# Patient Record
Sex: Female | Born: 1950 | Race: White | Hispanic: No | Marital: Single | Smoking: Current some day smoker
Health system: Southern US, Community
[De-identification: ages and names within clinical notes are randomized; demographics above are authoritative.]

## PROBLEM LIST (undated history)

## (undated) DIAGNOSIS — S7410XA Injury of femoral nerve at hip and thigh level, unspecified leg, initial encounter: Secondary | ICD-10-CM

## (undated) DIAGNOSIS — C801 Malignant (primary) neoplasm, unspecified: Secondary | ICD-10-CM

## (undated) DIAGNOSIS — E119 Type 2 diabetes mellitus without complications: Secondary | ICD-10-CM

## (undated) DIAGNOSIS — H9190 Unspecified hearing loss, unspecified ear: Secondary | ICD-10-CM

## (undated) DIAGNOSIS — F419 Anxiety disorder, unspecified: Secondary | ICD-10-CM

## (undated) DIAGNOSIS — I10 Essential (primary) hypertension: Secondary | ICD-10-CM

## (undated) DIAGNOSIS — G4733 Obstructive sleep apnea (adult) (pediatric): Secondary | ICD-10-CM

## (undated) DIAGNOSIS — E079 Disorder of thyroid, unspecified: Secondary | ICD-10-CM

## (undated) DIAGNOSIS — E785 Hyperlipidemia, unspecified: Secondary | ICD-10-CM

## (undated) HISTORY — DX: Obstructive sleep apnea (adult) (pediatric): G47.33

## (undated) HISTORY — PX: ACOUSTIC NEUROMA RESECTION: SHX5713

## (undated) HISTORY — PX: BLADDER SURGERY: SHX569

## (undated) HISTORY — PX: ECTOPIC PREGNANCY SURGERY: SHX613

## (undated) HISTORY — PX: ABDOMINAL HYSTERECTOMY: SHX81

## (undated) HISTORY — PX: CARDIAC CATHETERIZATION: SHX172

---

## 2005-09-19 ENCOUNTER — Ambulatory Visit: Payer: Self-pay | Admitting: Unknown Physician Specialty

## 2005-09-20 ENCOUNTER — Ambulatory Visit: Payer: Self-pay | Admitting: Unknown Physician Specialty

## 2005-12-25 ENCOUNTER — Ambulatory Visit: Payer: Self-pay | Admitting: Surgery

## 2006-03-06 ENCOUNTER — Ambulatory Visit: Payer: Self-pay | Admitting: Surgery

## 2006-03-08 ENCOUNTER — Ambulatory Visit: Payer: Self-pay | Admitting: Surgery

## 2010-02-17 ENCOUNTER — Observation Stay: Payer: Self-pay | Admitting: Internal Medicine

## 2010-09-07 ENCOUNTER — Emergency Department: Payer: Self-pay | Admitting: Emergency Medicine

## 2010-09-23 ENCOUNTER — Emergency Department: Payer: Self-pay | Admitting: Unknown Physician Specialty

## 2010-10-03 ENCOUNTER — Ambulatory Visit: Payer: Self-pay | Admitting: Internal Medicine

## 2010-10-20 ENCOUNTER — Ambulatory Visit: Payer: Self-pay | Admitting: Internal Medicine

## 2011-04-30 ENCOUNTER — Ambulatory Visit: Payer: Self-pay | Admitting: Internal Medicine

## 2012-03-24 IMAGING — CR DG KNEE COMPLETE 4+V*R*
1 series · 4 of 4 positions shown · non-contrast
Comparison: none

REASON FOR EXAM: pain 2nd to fall
COMMENTS:   May transport without cardiac monitor

PROCEDURE:     DXR - DXR KNEE RT COMP WITH OBLIQUES  - September 07, 2010  [DATE]
RESULT:     No fracture, dislocation or other acute bony abnormality is
identified. The knee joint space is well maintained. The patella is intact.

[Series 1: view not recorded · 0.17mm/px · 4 of 4 slices shown]
[im 1/4]
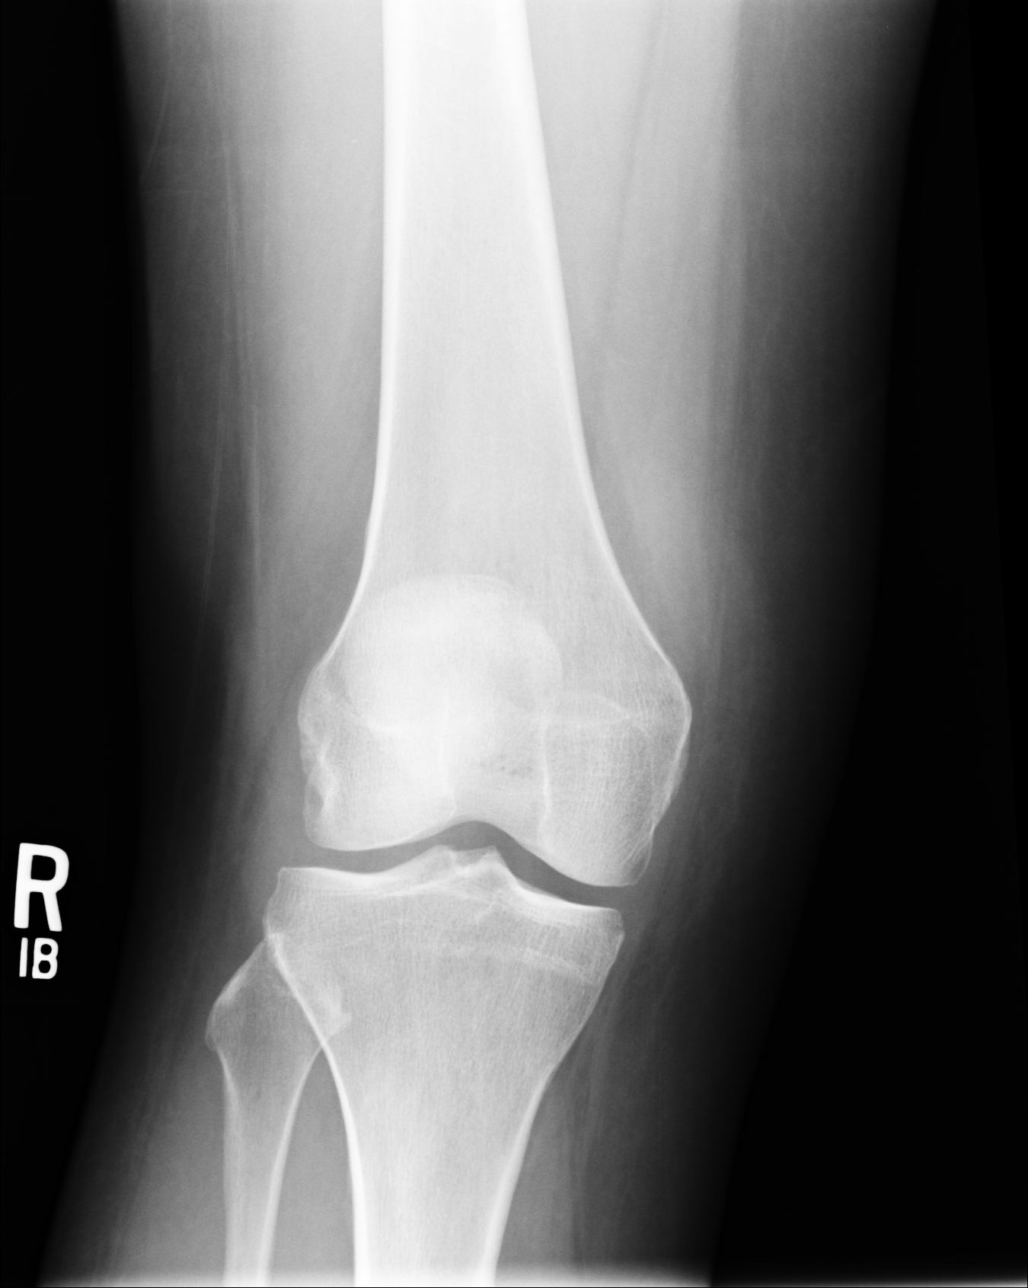
[im 2/4]
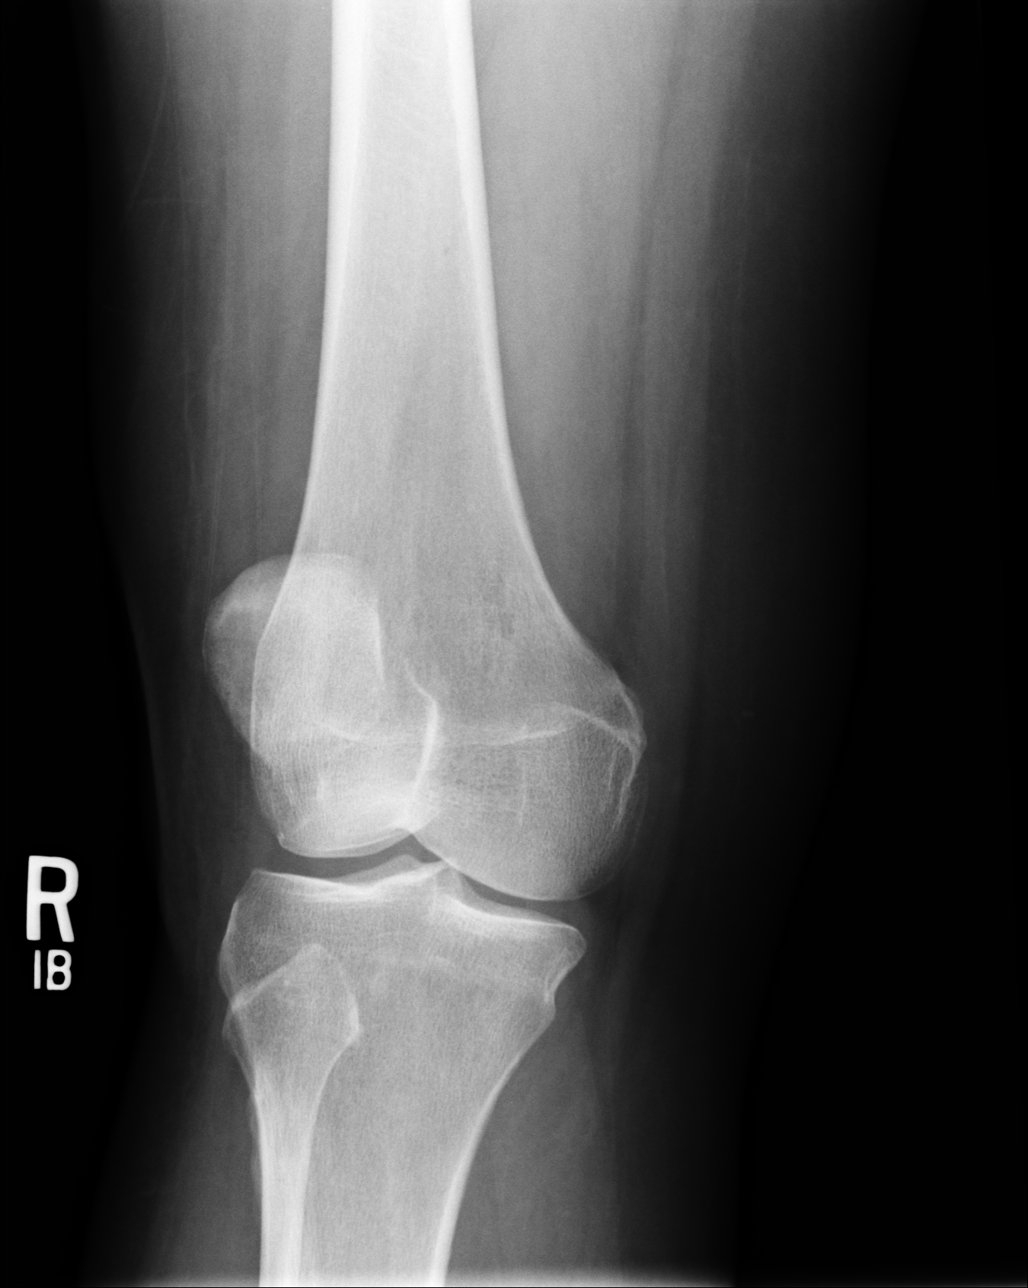
[im 3/4]
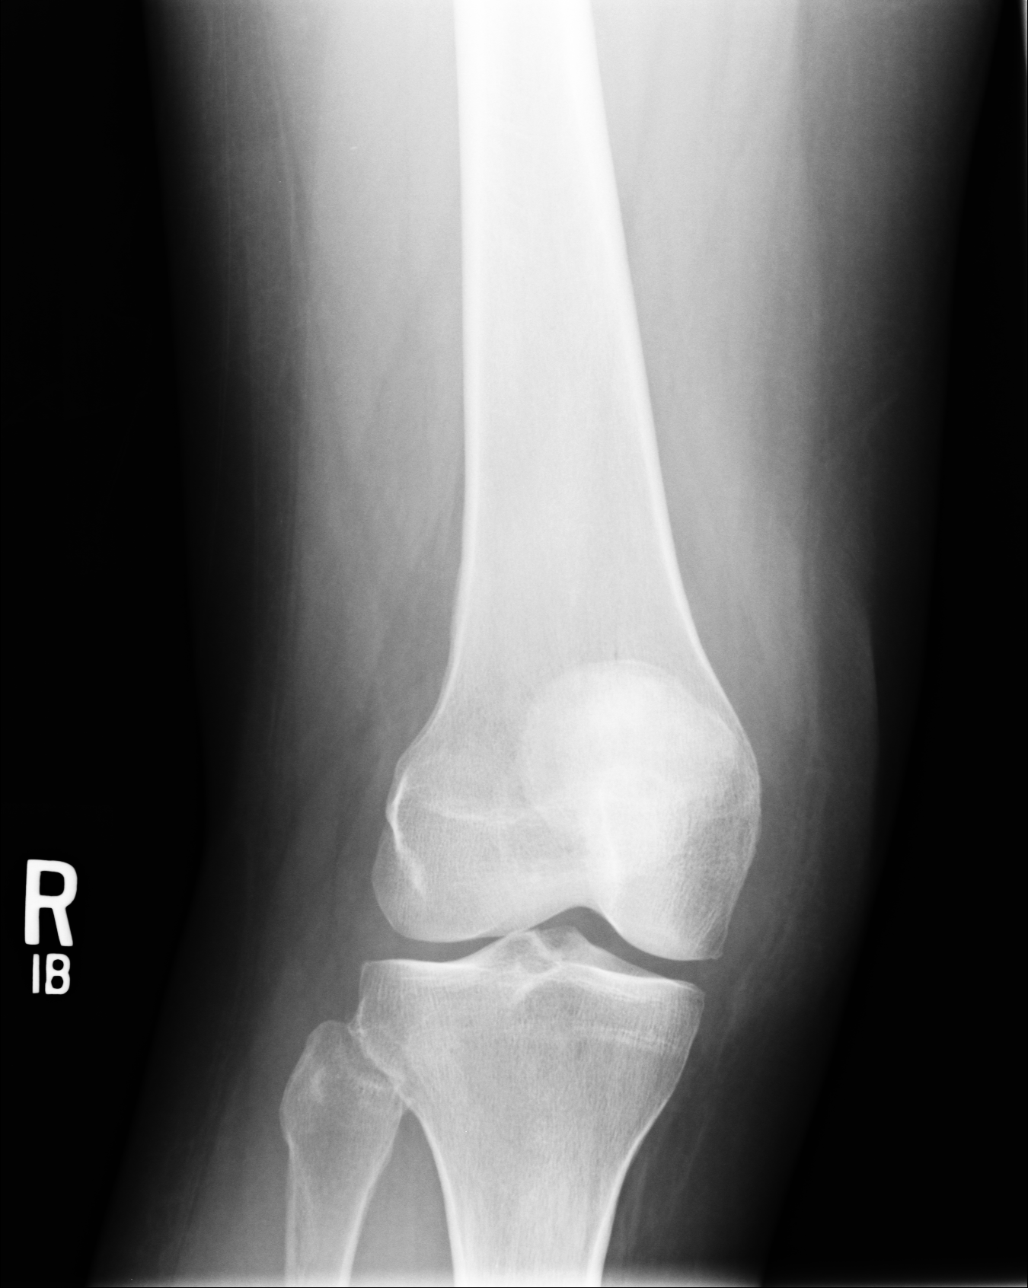
[im 4/4]
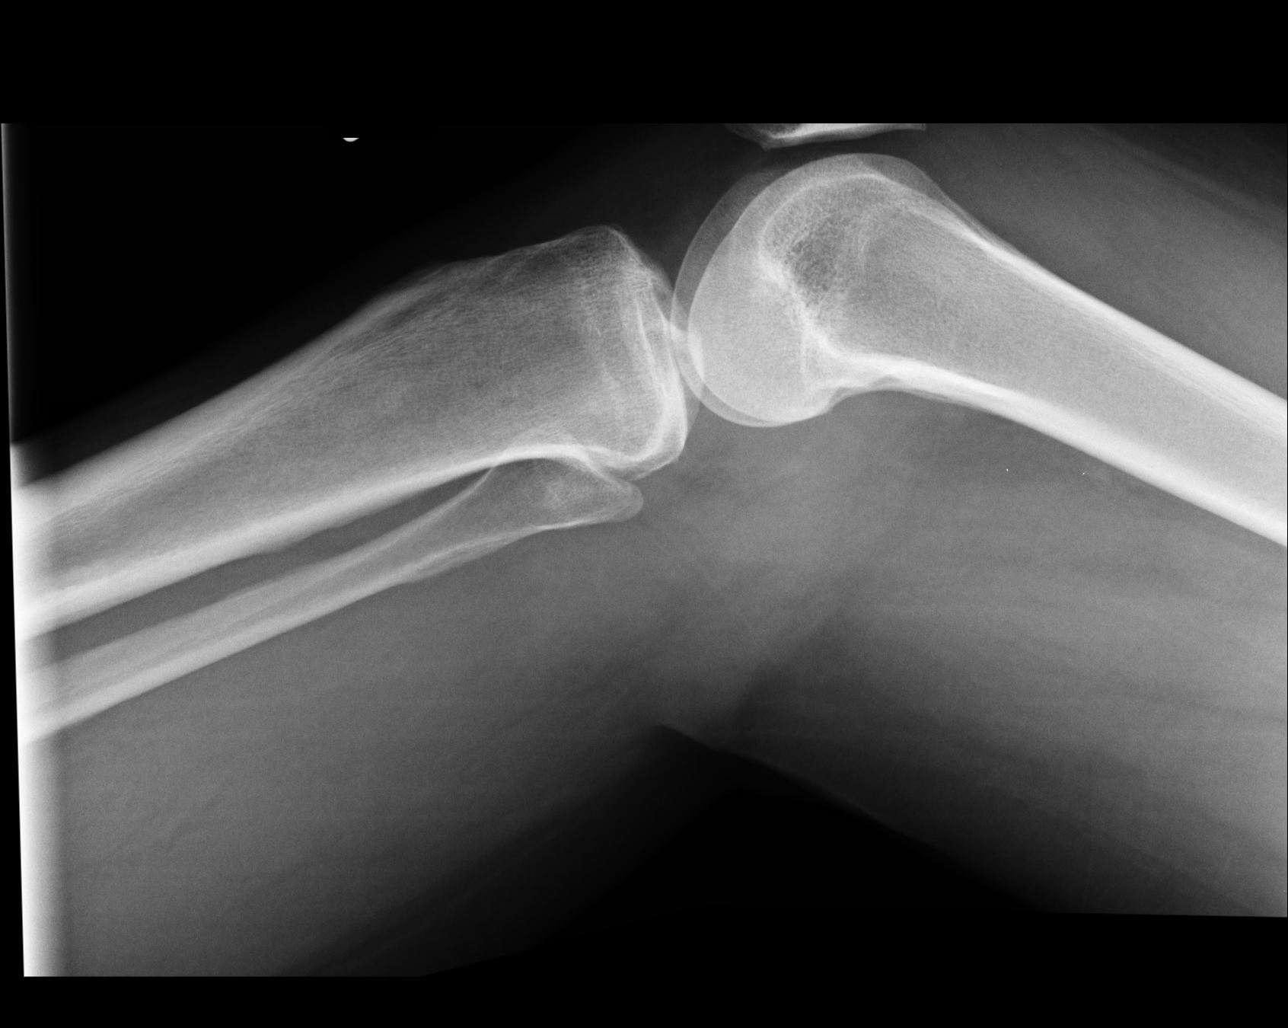

[4 of 4 positions shown; findings below may reference images not displayed]

IMPRESSION: 1.     No acute changes are identified.

## 2015-11-04 ENCOUNTER — Encounter: Payer: Self-pay | Admitting: *Deleted

## 2015-11-04 ENCOUNTER — Ambulatory Visit
Admission: EM | Admit: 2015-11-04 | Discharge: 2015-11-04 | Disposition: A | Discharge status: Home / Self Care | Payer: Managed Care, Other (non HMO) | Attending: Family Medicine | Admitting: Family Medicine

## 2015-11-04 DIAGNOSIS — M25562 Pain in left knee: Secondary | ICD-10-CM | POA: Diagnosis not present

## 2015-11-04 DIAGNOSIS — S8002XA Contusion of left knee, initial encounter: Secondary | ICD-10-CM

## 2015-11-04 DIAGNOSIS — M25561 Pain in right knee: Secondary | ICD-10-CM

## 2015-11-04 HISTORY — DX: Injury of femoral nerve at hip and thigh level, unspecified leg, initial encounter: S74.10XA

## 2015-11-04 HISTORY — DX: Malignant (primary) neoplasm, unspecified: C80.1

## 2015-11-04 HISTORY — DX: Unspecified hearing loss, unspecified ear: H91.90

## 2015-11-04 MED ORDER — OXYCODONE HCL 10 MG PO TABS: 10.0000 mg | ORAL_TABLET | Freq: Three times a day (TID) | ORAL | Status: DC | PRN
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
## 2016-02-06 DIAGNOSIS — F17210 Nicotine dependence, cigarettes, uncomplicated: Secondary | ICD-10-CM | POA: Insufficient documentation

## 2016-02-06 DIAGNOSIS — Z86018 Personal history of other benign neoplasm: Secondary | ICD-10-CM | POA: Insufficient documentation

## 2016-02-06 DIAGNOSIS — F418 Other specified anxiety disorders: Secondary | ICD-10-CM | POA: Insufficient documentation

## 2016-08-31 ENCOUNTER — Other Ambulatory Visit: Payer: Self-pay | Admitting: Orthopedic Surgery

## 2016-08-31 DIAGNOSIS — M2392 Unspecified internal derangement of left knee: Secondary | ICD-10-CM

## 2016-08-31 DIAGNOSIS — M1712 Unilateral primary osteoarthritis, left knee: Secondary | ICD-10-CM

## 2017-05-14 ENCOUNTER — Encounter: Payer: Self-pay | Admitting: Emergency Medicine

## 2017-05-14 ENCOUNTER — Emergency Department: Payer: Medicare Other

## 2017-05-14 ENCOUNTER — Emergency Department
Admission: EM | Admit: 2017-05-14 | Discharge: 2017-05-14 | Disposition: A | Discharge status: Home / Self Care | Payer: Medicare Other | Attending: Emergency Medicine | Admitting: Emergency Medicine

## 2017-05-14 ENCOUNTER — Ambulatory Visit
Admission: EM | Admit: 2017-05-14 | Discharge: 2017-05-14 | Disposition: A | Discharge status: Home / Self Care | Payer: Medicare Other | Attending: Emergency Medicine | Admitting: Emergency Medicine

## 2017-05-14 ENCOUNTER — Encounter: Payer: Self-pay | Admitting: Gynecology

## 2017-05-14 DIAGNOSIS — R42 Dizziness and giddiness: Secondary | ICD-10-CM

## 2017-05-14 DIAGNOSIS — H8149 Vertigo of central origin, unspecified ear: Secondary | ICD-10-CM | POA: Insufficient documentation

## 2017-05-14 DIAGNOSIS — F17200 Nicotine dependence, unspecified, uncomplicated: Secondary | ICD-10-CM | POA: Insufficient documentation

## 2017-05-14 DIAGNOSIS — Z79899 Other long term (current) drug therapy: Secondary | ICD-10-CM | POA: Diagnosis not present

## 2017-05-14 DIAGNOSIS — R270 Ataxia, unspecified: Secondary | ICD-10-CM | POA: Diagnosis not present

## 2017-05-14 LAB — BASIC METABOLIC PANEL
Anion gap: 7 (ref 5–15)
BUN: 17 mg/dL (ref 6–20)
CHLORIDE: 99 mmol/L — AB (ref 101–111)
CO2: 33 mmol/L — AB (ref 22–32)
CREATININE: 0.85 mg/dL (ref 0.44–1.00)
Calcium: 9.1 mg/dL (ref 8.9–10.3)
GFR calc non Af Amer: >60 mL/min (ref >60)
Glucose, Bld: 108 mg/dL — ABNORMAL HIGH (ref 65–99)
POTASSIUM: 3.1 mmol/L — AB (ref 3.5–5.1)
SODIUM: 139 mmol/L (ref 135–145)

## 2017-05-14 LAB — URINALYSIS, COMPLETE (UACMP) WITH MICROSCOPIC
BACTERIA UA: NONE SEEN
BILIRUBIN URINE: NEGATIVE
Glucose, UA: NEGATIVE mg/dL
KETONES UR: NEGATIVE mg/dL
LEUKOCYTES UA: NEGATIVE
Nitrite: NEGATIVE
PROTEIN: NEGATIVE mg/dL
Specific Gravity, Urine: 1.006 (ref 1.005–1.030)
pH: 6 (ref 5.0–8.0)

## 2017-05-14 LAB — CBC
HEMATOCRIT: 38.7 % (ref 35.0–47.0)
Hemoglobin: 13.5 g/dL (ref 12.0–16.0)
MCH: 33.2 pg (ref 26.0–34.0)
MCHC: 35 g/dL (ref 32.0–36.0)
MCV: 94.7 fL (ref 80.0–100.0)
Platelets: 234 10*3/uL (ref 150–440)
RBC: 4.08 MIL/uL (ref 3.80–5.20)
RDW: 15.4 % — ABNORMAL HIGH (ref 11.5–14.5)
WBC: 7.6 10*3/uL (ref 3.6–11.0)

## 2017-05-14 LAB — TROPONIN I

## 2017-05-14 MED ORDER — BUTALBITAL-APAP-CAFFEINE 50-325-40 MG PO TABS
2.0000 | ORAL_TABLET | Freq: Once | ORAL | Status: AC
  Administered 2017-05-14: 2 via ORAL
  Filled 2017-05-14: qty 2

## 2017-05-14 MED ORDER — MECLIZINE HCL 25 MG PO TABS
25.0000 mg | ORAL_TABLET | Freq: Once | ORAL | Status: AC
  Administered 2017-05-14: 25 mg via ORAL
  Filled 2017-05-14: qty 1

## 2017-05-14 MED ORDER — MECLIZINE HCL 25 MG PO TABS: 25.0000 mg | ORAL_TABLET | Freq: Three times a day (TID) | ORAL | 0 refills | Status: AC | PRN

## 2017-05-14 NOTE — ED Provider Notes (Signed)
[facility] Emergency Department Provider Note  Time seen: 9:26 PM  I have reviewed the triage vital signs and the nursing notes.   HISTORY  Chief Complaint Dizziness    HPI Debbie Hendricks is a 66 y.o. 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 According to the son for the past 1.5 weeks the patient has appeared more off balance having to hold onto walls at times when she is walking. The patient states she feels dizzy almost a spinning sensation. States a history of ataxia since she had an acoustic neuroma operated on in 1996. But states this is worse over the past 1.5 weeks. She does state approximately 2 weeks ago she had a fall in which she did hit her head but denies any LOC. Denies any blood thinner use. Patient states the off-balance sensation seems to have occurred around that time. 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 States this is unchanged. Denies any weakness or numbness, confusion, slurred speech or headache.  Past Medical History:  Diagnosis Date  . Cancer ([place])   . Femoral nerve injury   . Hearing loss    right side    There are no active problems to display for this patient.   Past Surgical History:  Procedure Laterality Date  . ABDOMINAL HYSTERECTOMY    . ACOUSTIC NEUROMA RESECTION    . BLADDER SURGERY    . CARDIAC CATHETERIZATION    . ECTOPIC PREGNANCY SURGERY      Prior to Admission medications   Medication Sig Start Date End Date Taking? Authorizing Provider  amLODipine (NORVASC) 5 MG tablet Take 5 mg by mouth daily.    [provider]  aspirin 81 MG chewable tablet Chew by mouth daily.    [provider]  clonazePAM (KLONOPIN) 1 MG tablet Take 1 mg by mouth 3 (three) times daily as needed for anxiety.    [provider]  fluticasone  (FLONASE) 50 MCG/ACT nasal spray Place into both nostrils daily.    [provider]  hydrOXYzine (ATARAX/VISTARIL) 25 MG tablet Take 25 mg by mouth every 4 (four) hours as needed.    [provider]  levothyroxine (SYNTHROID, LEVOTHROID) 100 MCG tablet Take 100 mcg by mouth daily before breakfast.    [provider]  methocarbamol (ROBAXIN) 500 MG tablet Take 500 mg by mouth 4 (four) times daily.    [provider]  montelukast (SINGULAIR) 10 MG tablet Take 10 mg by mouth at bedtime.    [provider]  omeprazole (PRILOSEC) 40 MG capsule Take 40 mg by mouth daily.    [provider]  pravastatin (PRAVACHOL) 10 MG tablet Take 10 mg by mouth daily.    [provider]  venlafaxine (EFFEXOR) 75 MG tablet Take 75 mg by mouth 2 (two) times daily with a meal.    [provider]    Allergies  Allergen Reactions  . Gabapentin   . Morphine And Related     No family history on file.  Social History Social History  Substance Use Topics  . Smoking status: Current Every Day Smoker  . Smokeless tobacco: Never Used  . Alcohol use No    Review of Systems Constitutional: Negative for fever. Cardiovascular: Negative for chest pain. Respiratory: Negative for shortness of breath. Gastrointestinal: Negative for abdominal pain  Musculoskeletal: Negative for back pain. Neurological: Negative for . Negative for focal weakness or numbness. All other ROS negative  ____________________________________________   PHYSICAL EXAM:  VITAL SIGNS: ED Triage Vitals [05/14/17 1930]  Enc Vitals Group     BP (!) 154/83     Pulse Rate 63     Resp 18     Temp 97.7 F (36.5 C)     Temp src      SpO2 98 %     Weight 164 lb (74.4 kg)     Height 5\' 8"  (1.727 m)     Head Circumference      Peak Flow      Pain Score 9     Pain Loc      Pain Edu?      Excl. in [place]?     Constitutional: Alert and oriented. Well appearing and in no  distress.Patient does have a moderate head tremor, which she states is unchanged since 1996. Eyes: Normal exam ENT   Head: Normocephalic and atraumatic.   Mouth/Throat: Mucous membranes are moist. Cardiovascular: Normal rate, regular rhythm. No murmur Respiratory: Normal respiratory effort without tachypnea nor retractions. Breath sounds are clear Gastrointestinal: Soft and nontender. No distention.  Musculoskeletal: Nontender with normal range of motion in all extremities. Neurologic:  Normal speech and language. No gross focal neurologic deficits. Equal grip strength bilaterally. No pronator drift.  Skin:  Skin is warm, dry and intact.  Psychiatric: Mood and affect are normal.   ____________________________________________    EKG  EKG reviewed and interpreted by myself shows normal sinus rhythm at 63 bpm, narrow QRS, normal axis, normal intervals, no ST changes.  ____________________________________________    RADIOLOGY  CT head negative  ____________________________________________   INITIAL IMPRESSION / ASSESSMENT AND PLAN / ED COURSE  Pertinent labs & imaging results that were available during my care of the patient were reviewed by me and considered in my medical decision making (see chart for details).  The patient presents to the emergency department for increased off-balance sensation. States some ataxia since her operation in 1996. Patient had a fall approximately 10 days ago. CT head shows no acute abnormality. I suggested obtaining an MRI. However, the patient has a fairly moderate head tremor, states she would not be able to hold still for an MRI. Patient has a normal neurological exam besides the head tremor. Patient's description of her symptoms seem consistent with vertigo. I discussed a trial of meclizine. Patient is able to ambulate in the emergency department well although states she feels off balance but is able to walk straight. Patient is agreeable to  trying meclizine and following up with neurology. I discussed return precautions for any headache, confusion, slurred speech, weakness, numbness or worsening ataxia. Patient agreeable.  ____________________________________________   FINAL CLINICAL IMPRESSION(S) / ED DIAGNOSES  Vertigo    Harvest Dark, MD 05/14/17 2136

## 2017-05-14 NOTE — Discharge Instructions (Signed)
Am concerned that she have a central cause of your symptoms, such as a mass or brain stem stroke. Go to the [facility] ED. They are expecting you.

## 2017-05-14 NOTE — ED Triage Notes (Signed)
Pt to triage via w/c ; st has had vertigo since last Thursday, dizziness when ambulating and lying supine; son-in-law reports that he noticed her being off-balance; st hx acoustic neuroma in 1996 and has had no incidents since that surgery; pt sent over by [facility] for further evaluation; st fell down approx 10 steps prior to onset vertigo and did hit head on wall with no LOC; st has hx migraines but HA have been worse recently

## 2017-05-14 NOTE — ED Triage Notes (Signed)
Patient c/o vertigo x 6 days.

## 2017-05-14 NOTE — ED Provider Notes (Signed)
HPI  SUBJECTIVE:  Debbie Hendricks is a 66 y.o. female who presents with constant, daily waxing and waning vertigo described as the room spinning around her and and disequilibrium for 6 days. She reports nausea and multiple episodes of emesis. States that she is tolerating small amounts of liquids but is unable to tolerate any solids. She reports discoordination and difficulties walking on assistant. She reports fevers of 102 over the weekend, but she had the vertigo prior to fever. She was told that she had fluid in ear on the left side 2 weeks ago she reports decreased hearing but she denies tinnitus, otorrhea, or sensation of fluid behind her ear. She does report ear pressure but no ear pain. She states that she was discontinued from Remeron, started on 2 days of an antipsychotic, which made her baseline tremors worse, so she discontinued this. She states that she restarted her Remeron which has made her vertigo/disequilibrium much worse. She denies any other medication changes. She denies arm or leg weakness, visual changes, blurry or double vision, facial droop, aphasia, slurred speech. No palpitations, chest pain, presyncope, syncope. No antipyretic in the past 6-8 hours. She states that she has tried Dramamine and ibuprofen. The Dramamine helps slightly. Looking at blanks surfaces and closing her eyes seems to help. Symptoms are worse with lying flat, standing up, opening her eyes. She is not sure if the vertigo is associated with head turning. 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 She has history of anxiety, hypertension, MI 2010, palpitations thought to be from anxiety for which she takes a beta blocker. She is a former smoker. No history of alcohol use, stroke, arrhythmia. PMD: [facility].  Past Medical History:  Diagnosis Date  . Cancer ([place])   . Femoral nerve injury   .  Hearing loss    right side    Past Surgical History:  Procedure Laterality Date  . ABDOMINAL HYSTERECTOMY    . BLADDER SURGERY      No family history on file.  Social History  Substance Use Topics  . Smoking status: Current Every Day Smoker  . Smokeless tobacco: Never Used  . Alcohol use No    No current facility-administered medications for this encounter.   Current Outpatient Prescriptions:  .  amLODipine (NORVASC) 5 MG tablet, Take 5 mg by mouth daily., Disp: , Rfl:  .  aspirin 81 MG chewable tablet, Chew by mouth daily., Disp: , Rfl:  .  clonazePAM (KLONOPIN) 1 MG tablet, Take 1 mg by mouth 3 (three) times daily as needed for anxiety., Disp: , Rfl:  .  fluticasone (FLONASE) 50 MCG/ACT nasal spray, Place into both nostrils daily., Disp: , Rfl:  .  hydrOXYzine (ATARAX/VISTARIL) 25 MG tablet, Take 25 mg by mouth every 4 (four) hours as needed., Disp: , Rfl:  .  levothyroxine (SYNTHROID, LEVOTHROID) 100 MCG tablet, Take 100 mcg by mouth daily before breakfast., Disp: , Rfl:  .  methocarbamol (ROBAXIN) 500 MG tablet, Take 500 mg by mouth 4 (four) times daily., Disp: , Rfl:  .  montelukast (SINGULAIR) 10 MG tablet, Take 10 mg by mouth at bedtime., Disp: , Rfl:  .  omeprazole (PRILOSEC) 40 MG capsule, Take 40 mg by mouth daily., Disp: , Rfl:  .  venlafaxine (EFFEXOR) 75 MG tablet, Take 75 mg by mouth 2 (two) times daily with a meal., Disp: , Rfl:  .  pravastatin (PRAVACHOL) 10 MG tablet, Take 10 mg by mouth daily., Disp: , Rfl:   No Known Allergies   ROS  As noted in HPI.   Physical Exam  BP (!) 144/83 (BP Location: Left Arm)   Pulse 72   Temp 97.9 F (36.6 C) (Oral)   Resp 16   SpO2 97%    Orthostatic VS for the past 24 hrs:  BP- Lying Pulse- Lying BP- Sitting Pulse- Sitting BP- Standing at 0 minutes Pulse- Standing at 0 minutes  05/14/17 1615 144/83 72 134/85 70 126/84 72      Constitutional: Well developed, well nourished, no acute distress Eyes:  EOMI,  conjunctiva normal bilaterally HENT: Normocephalic, atraumatic,mucus membranes moist Respiratory: Normal inspiratory effort lungs clear bilaterally Cardiovascular: Normal rate regular rhythm no murmurs rubs or gallops. No carotid bruit GI: nondistended skin: No rash, skin intact Musculoskeletal: no deformities Neurologic: Alert & oriented x 3, cranial nerves II through XII intact as tested. Positive bilateral hand tremor, difficulty with finger-nose. Heel shin within normal limits. Patient unable to perform tandem gait. She has difficulty ambulating independently and has to hang onto the wall. Romberg negative. Questionable positive Dix-Hallpike right side, symptoms are nothing worse with sitting up. Psychiatric: Speech and behavior appropriate   ED Course   Medications - No data to display  No orders of the defined types were placed in this encounter.   No results found for this or any previous visit (from the past 24 hour(s)). No results found.  ED Clinical Impression  Vertigo  Ataxia   ED Assessment/Plan  Concern for cerebellar infarct given the duration and intensity of the vertigo, and the significant ataxia. recommended that she goes to the [facility] ER for comprehensive evaluation, possible IV fluids as I think that she is somewhat dehydrated from decreased by mouth intake. She is not completely orthostatic but nearly so. She may benefit from IV Valium to help with her vertigo. Doubt cardiac cause of her symptoms. Patient states that she can have her son drive her. She is in no shape to drive by herself.  Discussed MDM, rationale for transfer with patient. Patient agrees with plan. Notified ED RN.   Meds ordered this encounter  Medications  . aspirin 81 MG chewable tablet    Sig: Chew by mouth daily.    *This clinic note was created using Dragon dictation software. Therefore, there may be occasional mistakes despite careful proofreading.  ?   Melynda Ripple,  MD 05/14/17 [phone]

## 2017-05-14 NOTE — Discharge Instructions (Signed)
Please follow-up with Dr. Melrose Nakayama regarding your dizziness/feeling off balance. Please take her meclizine as needed, as prescribed. Return to the emergency department for any headache, confusion, slurred speech, weakness or numbness of any arm or leg, or worsening off-balance sensation.

## 2017-05-30 DIAGNOSIS — G894 Chronic pain syndrome: Secondary | ICD-10-CM | POA: Insufficient documentation

## 2018-06-23 DIAGNOSIS — I639 Cerebral infarction, unspecified: Secondary | ICD-10-CM | POA: Insufficient documentation

## 2018-07-19 ENCOUNTER — Other Ambulatory Visit: Payer: Self-pay

## 2018-07-19 ENCOUNTER — Ambulatory Visit
Admission: EM | Admit: 2018-07-19 | Discharge: 2018-07-19 | Disposition: A | Discharge status: Home / Self Care | Payer: Medicare Other | Attending: Family Medicine | Admitting: Family Medicine

## 2018-07-19 DIAGNOSIS — L309 Dermatitis, unspecified: Secondary | ICD-10-CM

## 2018-07-19 DIAGNOSIS — L03211 Cellulitis of face: Secondary | ICD-10-CM

## 2018-07-19 DIAGNOSIS — L03113 Cellulitis of right upper limb: Secondary | ICD-10-CM

## 2018-07-19 HISTORY — DX: Anxiety disorder, unspecified: F41.9

## 2018-07-19 MED ORDER — SULFAMETHOXAZOLE-TRIMETHOPRIM 800-160 MG PO TABS: 1.0000 | ORAL_TABLET | Freq: Two times a day (BID) | ORAL | 0 refills | Status: DC

## 2018-07-19 MED ORDER — TRIAMCINOLONE ACETONIDE 0.1 % EX CREA: 1.0000 "application " | TOPICAL_CREAM | Freq: Two times a day (BID) | CUTANEOUS | 0 refills | Status: DC

## 2018-07-19 NOTE — ED Triage Notes (Signed)
Pt with areas in between her fingers that were itchy and swollen and she scratched until they scabbed. Now with itchy, warm chin.

## 2018-08-05 ENCOUNTER — Encounter: Payer: Self-pay | Admitting: Emergency Medicine

## 2018-08-05 ENCOUNTER — Ambulatory Visit
Admission: EM | Admit: 2018-08-05 | Discharge: 2018-08-05 | Disposition: A | Discharge status: Home / Self Care | Payer: Medicare Other | Attending: Family Medicine | Admitting: Family Medicine

## 2018-08-05 ENCOUNTER — Other Ambulatory Visit: Payer: Self-pay

## 2018-08-05 DIAGNOSIS — L270 Generalized skin eruption due to drugs and medicaments taken internally: Secondary | ICD-10-CM

## 2018-08-05 DIAGNOSIS — R21 Rash and other nonspecific skin eruption: Secondary | ICD-10-CM | POA: Diagnosis not present

## 2018-08-05 MED ORDER — METHYLPREDNISOLONE 4 MG PO TBPK: ORAL_TABLET | ORAL | 0 refills | Status: DC

## 2018-08-05 MED ORDER — HYDROXYZINE HCL 25 MG PO TABS: 25.0000 mg | ORAL_TABLET | Freq: Four times a day (QID) | ORAL | 0 refills | Status: DC

## 2018-08-05 MED ORDER — TRIAMCINOLONE ACETONIDE 0.1 % EX CREA: 1.0000 "application " | TOPICAL_CREAM | Freq: Two times a day (BID) | CUTANEOUS | 0 refills | Status: DC

## 2018-08-05 NOTE — Discharge Instructions (Signed)
Apply the steroid cream sparingly to areas that are intensely itchy.

## 2018-08-05 NOTE — ED Triage Notes (Signed)
Patient c/o itchy rash on her chest, neck and under her breast that started 4 days ago.

## 2018-08-05 NOTE — ED Provider Notes (Signed)
[facility]    CSN: [id number] Arrival date & time: 08/05/18  1926     History   Chief Complaint Chief Complaint  Patient presents with  . Pruritis  . Rash    HPI Debbie Hendricks is a 67 y.o. female.   HPI  67 year old female presents with an itchy rash that is on her chest neck and under her breast started about 4 days ago.  Seen here on 07/19/2018 diagnosed with cellulitis of her face,placed on Septra DS, her eczema triamcinolone cream.  She states that she finished the Septra yesterday but the rash has become so itchy that has interfered with her sleep.  Is that her nipples are killing her from the itching even had to cut her fingernails so as not to abrade them so badly.  He has no rash on her arms legs or her back mostly on the sun exposed areas.  Some swelling of the neck without any breathing difficulties.  Shows me how she has been retching with her knuckles which is probably caused the swelling.  Is had no fever or chills.  This has resolved.        Past Medical History:  Diagnosis Date  . Anxiety   . Cancer ([place])   . Femoral nerve injury   . Hearing loss    right side    There are no active problems to display for this patient.   Past Surgical History:  Procedure Laterality Date  . ABDOMINAL HYSTERECTOMY    . ACOUSTIC NEUROMA RESECTION    . BLADDER SURGERY    . CARDIAC CATHETERIZATION    . ECTOPIC PREGNANCY SURGERY      OB History   None      Home Medications    Prior to Admission medications   Medication Sig Start Date End Date Taking? Authorizing Provider  amLODipine (NORVASC) 5 MG tablet Take 5 mg by mouth daily.    [provider]  aspirin 81 MG chewable tablet Chew by mouth daily.    [provider]  clonazePAM (KLONOPIN) 1 MG tablet Take 1 mg by mouth 3 (three) times daily as needed for anxiety.    [provider]  fluticasone (FLONASE) 50 MCG/ACT nasal spray Place into both nostrils daily.     [provider]  hydrOXYzine (ATARAX/VISTARIL) 25 MG tablet Take 1 tablet (25 mg total) by mouth every 6 (six) hours. 08/05/18   Lorin Picket, PA-C  levothyroxine (SYNTHROID, LEVOTHROID) 100 MCG tablet Take 125 mcg by mouth daily before breakfast.     [provider]  meclizine (ANTIVERT) 25 MG tablet Take 1 tablet (25 mg total) by mouth 3 (three) times daily as needed for dizziness. 05/14/17   Harvest Dark, MD  methocarbamol (ROBAXIN) 500 MG tablet Take 500 mg by mouth 4 (four) times daily.    [provider]  methylPREDNISolone (MEDROL DOSEPAK) 4 MG TBPK tablet Take per package instructions 08/05/18   Crecencio Mc P, PA-C  montelukast (SINGULAIR) 10 MG tablet Take 10 mg by mouth at bedtime.    [provider]  omeprazole (PRILOSEC) 40 MG capsule Take 40 mg by mouth daily.    [provider]  pravastatin (PRAVACHOL) 10 MG tablet Take 10 mg by mouth daily.    [provider]  SPIRIVA HANDIHALER 18 MCG inhalation capsule INHALE 1 CAPSULE VIA HANDIHALER ONCE DAILY AT THE SAME TIME EVERY DAY 05/29/18   [provider]  sulfamethoxazole-trimethoprim (BACTRIM DS,SEPTRA DS) 800-160 MG  tablet Take 1 tablet by mouth 2 (two) times daily. 07/19/18   Norval Gable, MD  triamcinolone cream (KENALOG) 0.1 % Apply 1 application topically 2 (two) times daily. 08/05/18   Lorin Picket, PA-C  venlafaxine (EFFEXOR) 75 MG tablet Take 75 mg by mouth 2 (two) times daily with a meal.    [provider]    Family History History reviewed. No pertinent family history.  Social History Social History   Tobacco Use  . Smoking status: Current Every Day Smoker    Packs/day: 0.50    Types: Cigarettes  . Smokeless tobacco: Never Used  Substance Use Topics  . Alcohol use: No  . Drug use: No     Allergies   Amoxicillin; Gabapentin; and Morphine and related   Review of Systems Review of Systems  Constitutional: Positive for  activity change. Negative for appetite change, chills, fatigue and fever.  Skin: Positive for color change and rash.  All other systems reviewed and are negative.    Physical Exam Triage Vital Signs ED Triage Vitals  Enc Vitals Group     BP 08/05/18 1933 129/90     Pulse Rate 08/05/18 1933 90     Resp 08/05/18 1933 18     Temp 08/05/18 1933 98.4 F (36.9 C)     Temp Source 08/05/18 1933 Oral     SpO2 08/05/18 1933 95 %     Weight 08/05/18 1935 180 lb (81.6 kg)     Height 08/05/18 1935 5\' 8"  (1.727 m)     Head Circumference --      Peak Flow --      Pain Score 08/05/18 1933 10     Pain Loc --      Pain Edu? --      Excl. in [place]? --    No data found.  Updated Vital Signs BP 129/90 (BP Location: Left Arm)   Pulse 90   Temp 98.4 F (36.9 C) (Oral)   Resp 18   Ht 5\' 8"  (1.727 m)   Wt 180 lb (81.6 kg)   SpO2 95%   BMI 27.37 kg/m   Visual Acuity Right Eye Distance:   Left Eye Distance:   Bilateral Distance:    Right Eye Near:   Left Eye Near:    Bilateral Near:     Physical Exam  Constitutional: She is oriented to person, place, and time. She appears well-developed and well-nourished. No distress.  HENT:  Head: Normocephalic.  Eyes: Pupils are equal, round, and reactive to light. Right eye exhibits no discharge. Left eye exhibits no discharge.  Neck: Normal range of motion.  Pulmonary/Chest: Effort normal and breath sounds normal.  Musculoskeletal: Normal range of motion.  Neurological: She is alert and oriented to person, place, and time.  Skin: Skin is warm and dry. Rash noted. She is not diaphoretic. There is erythema.  Exam shows a erythematous macular rash over the upper torso and there is no papular component.  It is confluent in places.  Not appear to be any further rashes noticed on the back arms or legs.  Nursing note and vitals reviewed.    UC Treatments / Results  Labs (all labs ordered are listed, but only abnormal results are displayed) Labs  Reviewed - No data to display  EKG None  Radiology No results found.  Procedures Procedures (including critical care time)  Medications Ordered in UC Medications - No data to display  Initial Impression / Assessment and Plan /  UC Course  I have reviewed the triage vital signs and the nursing notes.  Pertinent labs & imaging results that were available during my care of the patient were reviewed by me and considered in my medical decision making (see chart for details).    Will treat her with a prednisone taper and provide her with hydroxyzine for the severe itching.  Will provide her with another tube of triamcinolone cream that she will use on the very intensely itching areas.  Is already stopped the Septra yesterday.  He needs to have problems she should follow-up with her primary care physician or a dermatologist Final Clinical Impressions(s) / UC Diagnoses   Final diagnoses:  Allergic drug rash     Discharge Instructions     Apply the steroid cream sparingly to areas that are intensely itchy.    ED Prescriptions    Medication Sig Dispense Auth. Provider   hydrOXYzine (ATARAX/VISTARIL) 25 MG tablet Take 1 tablet (25 mg total) by mouth every 6 (six) hours. 30 tablet Crecencio Mc P, PA-C   triamcinolone cream (KENALOG) 0.1 % Apply 1 application topically 2 (two) times daily. 30 g Crecencio Mc P, PA-C   methylPREDNISolone (MEDROL DOSEPAK) 4 MG TBPK tablet Take per package instructions 21 tablet Lorin Picket, PA-C     Controlled Substance Prescriptions [place] Controlled Substance Registry consulted? Not Applicable   Lorin Picket, PA-C 08/05/18 2051

## 2019-02-05 ENCOUNTER — Ambulatory Visit
Admission: EM | Admit: 2019-02-05 | Discharge: 2019-02-05 | Disposition: A | Discharge status: Home / Self Care | Payer: PRIVATE HEALTH INSURANCE | Attending: Family Medicine | Admitting: Family Medicine

## 2019-02-05 ENCOUNTER — Encounter: Payer: Self-pay | Admitting: Emergency Medicine

## 2019-02-05 ENCOUNTER — Other Ambulatory Visit: Payer: Self-pay

## 2019-02-05 DIAGNOSIS — J019 Acute sinusitis, unspecified: Secondary | ICD-10-CM

## 2019-02-05 DIAGNOSIS — F17210 Nicotine dependence, cigarettes, uncomplicated: Secondary | ICD-10-CM | POA: Diagnosis not present

## 2019-02-05 DIAGNOSIS — R21 Rash and other nonspecific skin eruption: Secondary | ICD-10-CM | POA: Diagnosis not present

## 2019-02-05 DIAGNOSIS — L03211 Cellulitis of face: Secondary | ICD-10-CM

## 2019-02-05 DIAGNOSIS — R0981 Nasal congestion: Secondary | ICD-10-CM

## 2019-02-05 HISTORY — DX: Disorder of thyroid, unspecified: E07.9

## 2019-02-05 HISTORY — DX: Hyperlipidemia, unspecified: E78.5

## 2019-02-05 HISTORY — DX: Essential (primary) hypertension: I10

## 2019-02-05 MED ORDER — MUPIROCIN 2 % EX OINT: 1.0000 "application " | TOPICAL_OINTMENT | Freq: Two times a day (BID) | CUTANEOUS | 0 refills | Status: AC

## 2019-02-05 MED ORDER — CEFDINIR 300 MG PO CAPS: 300.0000 mg | ORAL_CAPSULE | Freq: Two times a day (BID) | ORAL | 0 refills | Status: DC

## 2019-02-05 NOTE — ED Triage Notes (Signed)
Patient in today c/o rash, swelling and red spot on left cheek x 1 week. Patient saw PCP 01/29/19 and she said probably cellulitis and was treated with Doxycycline 100mg  for sinus and said it should probably help the cellulitis.

## 2019-02-05 NOTE — ED Provider Notes (Signed)
[facility]    CSN: [id number] Arrival date & time: 02/05/19  1703  History   Chief Complaint Chief Complaint  Patient presents with  . Rash    face   HPI   68 year old female presents with the above complaint.  Patient was recently seen by her primary care physician on 3/13.  Was diagnosed with sinusitis and possible cellulitis/periorbital cellulitis.  Was placed on doxycycline.  Patient states that she continues to have symptoms.  States that sinus pressure and pain persist.  She also states that the redness around the left side of face/cheek continues to persist.  She reports mild pain and associated swelling.  No documented fever.  She states that she is had no improvement with the doxycycline.  She has 4 days of medication left.  No other associated symptoms.  No other complaints.  PMH, Surgical Hx, Family Hx, Social History reviewed and updated as below.  Past Medical History:  Diagnosis Date  . Anxiety   . Cancer ([place])   . Femoral nerve injury   . Hearing loss    right side  . Hyperlipidemia   . Hypertension   . Thyroid disease    Past Surgical History:  Procedure Laterality Date  . ABDOMINAL HYSTERECTOMY    . ACOUSTIC NEUROMA RESECTION    . BLADDER SURGERY    . CARDIAC CATHETERIZATION    . ECTOPIC PREGNANCY SURGERY     OB History   No obstetric history on file.    Home Medications    Prior to Admission medications   Medication Sig Start Date End Date Taking? Authorizing Provider  amLODipine (NORVASC) 5 MG tablet Take 5 mg by mouth daily.   Yes [provider]  aspirin 81 MG chewable tablet Chew by mouth daily.   Yes [provider]  clonazePAM (KLONOPIN) 1 MG tablet Take 1 mg by mouth 3 (three) times daily as needed for anxiety.   Yes [provider]  fluticasone (FLONASE) 50 MCG/ACT nasal spray Place into both nostrils daily.   Yes [provider]  hydrOXYzine (ATARAX/VISTARIL) 25 MG tablet Take 1 tablet (25  mg total) by mouth every 6 (six) hours. 08/05/18  Yes Lorin Picket, PA-C  levothyroxine (SYNTHROID, LEVOTHROID) 100 MCG tablet Take 125 mcg by mouth daily before breakfast.    Yes [provider]  meclizine (ANTIVERT) 25 MG tablet Take 1 tablet (25 mg total) by mouth 3 (three) times daily as needed for dizziness. 05/14/17  Yes Paduchowski, Lennette Bihari, MD  montelukast (SINGULAIR) 10 MG tablet Take 10 mg by mouth at bedtime.   Yes [provider]  omeprazole (PRILOSEC) 40 MG capsule Take 40 mg by mouth daily.   Yes [provider]  pravastatin (PRAVACHOL) 10 MG tablet Take 10 mg by mouth daily.   Yes [provider]  SPIRIVA HANDIHALER 18 MCG inhalation capsule INHALE 1 CAPSULE VIA HANDIHALER ONCE DAILY AT THE SAME TIME EVERY DAY 05/29/18  Yes [provider]  venlafaxine (EFFEXOR) 75 MG tablet Take 75 mg by mouth 2 (two) times daily with a meal.   Yes [provider]  cefdinir (OMNICEF) 300 MG capsule Take 1 capsule (300 mg total) by mouth 2 (two) times daily. 02/05/19   Coral Spikes, DO  mupirocin ointment (BACTROBAN) 2 % Apply 1 application topically 2 (two) times daily for 7 days. 02/05/19 02/12/19  Coral Spikes, DO    Family History Family History  Problem Relation Age of Onset  .  Hypertension Mother   . Thyroid disease Mother   . Other Father 11       murdered    Social History Social History   Tobacco Use  . Smoking status: Current Some Day Smoker    Packs/day: 0.50    Types: Cigarettes  . Smokeless tobacco: Never Used  Substance Use Topics  . Alcohol use: No  . Drug use: No     Allergies   Sulfa antibiotics; Amoxicillin; Gabapentin; and Morphine and related   Review of Systems Review of Systems  Constitutional: Negative for fever.  HENT: Positive for congestion, sinus pressure and sinus pain.   Skin:       Facial redness/swelling.   Physical Exam Triage Vital Signs ED Triage Vitals  Enc Vitals Group     BP  02/05/19 1720 116/73     Pulse Rate 02/05/19 1720 82     Resp 02/05/19 1720 18     Temp 02/05/19 1720 98.2 F (36.8 C)     Temp Source 02/05/19 1720 Oral     SpO2 02/05/19 1720 96 %     Weight 02/05/19 1722 187 lb (84.8 kg)     Height 02/05/19 1722 5' 7.5" (1.715 m)     Head Circumference --      Peak Flow --      Pain Score 02/05/19 1720 10     Pain Loc --      Pain Edu? --      Excl. in [place]? --    Updated Vital Signs BP 116/73 (BP Location: Left Arm)   Pulse 82   Temp 98.2 F (36.8 C) (Oral)   Resp 18   Ht 5' 7.5" (1.715 m)   Wt 84.8 kg   SpO2 96%   BMI 28.86 kg/m   Visual Acuity Right Eye Distance:   Left Eye Distance:   Bilateral Distance:    Right Eye Near:   Left Eye Near:    Bilateral Near:     Physical Exam Vitals signs and nursing note reviewed.  Constitutional:      General: She is not in acute distress.    Appearance: Normal appearance.  HENT:     Head: Normocephalic and atraumatic.      Comments: Eschar noted at the level location.  Surrounding swelling and erythema.    Mouth/Throat:     Pharynx: Oropharynx is clear. No oropharyngeal exudate.  Eyes:     General:        Right eye: No discharge.        Left eye: No discharge.     Conjunctiva/sclera: Conjunctivae normal.  Cardiovascular:     Rate and Rhythm: Normal rate and regular rhythm.  Pulmonary:     Effort: Pulmonary effort is normal.     Breath sounds: Normal breath sounds.  Neurological:     Mental Status: She is alert.  Psychiatric:        Mood and Affect: Mood normal.        Behavior: Behavior normal.    UC Treatments / Results  Labs (all labs ordered are listed, but only abnormal results are displayed) Labs Reviewed - No data to display  EKG None  Radiology No results found.  Procedures Procedures (including critical care time)  Medications Ordered in UC Medications - No data to display  Initial Impression / Assessment and Plan / UC Course  I have reviewed the  triage vital signs and the nursing notes.  Pertinent labs & imaging  results that were available during my care of the patient were reviewed by me and considered in my medical decision making (see chart for details).    68 year old female presents with what appears to be facial cellulitis.  I am adding coverage with Omnicef.  Also adding Bactroban ointment.  Continue doxycycline.  I have advised her to go to the ER if she fails improve or worsens.  Final Clinical Impressions(s) / UC Diagnoses   Final diagnoses:  Cellulitis of face     Discharge Instructions     Continue the doxy.  Bactroban twice daily to the affected area.  Omnicef twice daily.  If you fail to improve or worsen, go to the hospital.  Dr. Lacinda Axon     ED Prescriptions    Medication Sig [place]. Provider   mupirocin ointment (BACTROBAN) 2 % Apply 1 application topically 2 (two) times daily for 7 days. 22 g Lillianne Eick G, DO   cefdinir (OMNICEF) 300 MG capsule Take 1 capsule (300 mg total) by mouth 2 (two) times daily. 14 capsule Coral Spikes, DO     Controlled Substance Prescriptions [place] Controlled Substance Registry consulted? Not Applicable   Coral Spikes, DO 02/05/19 5825

## 2019-02-05 NOTE — Discharge Instructions (Signed)
Continue the doxy.  Bactroban twice daily to the affected area.  Omnicef twice daily.  If you fail to improve or worsen, go to the hospital.  Dr. Lacinda Axon

## 2019-05-04 DIAGNOSIS — E1122 Type 2 diabetes mellitus with diabetic chronic kidney disease: Secondary | ICD-10-CM | POA: Insufficient documentation

## 2019-06-19 ENCOUNTER — Ambulatory Visit
Admission: EM | Admit: 2019-06-19 | Discharge: 2019-06-19 | Disposition: A | Discharge status: Home / Self Care | Payer: Medicare HMO

## 2019-06-19 ENCOUNTER — Other Ambulatory Visit: Payer: Self-pay

## 2019-06-19 DIAGNOSIS — L03213 Periorbital cellulitis: Secondary | ICD-10-CM | POA: Diagnosis not present

## 2019-06-19 DIAGNOSIS — K047 Periapical abscess without sinus: Secondary | ICD-10-CM | POA: Diagnosis not present

## 2019-06-19 DIAGNOSIS — K0889 Other specified disorders of teeth and supporting structures: Secondary | ICD-10-CM

## 2019-06-19 MED ORDER — CLINDAMYCIN HCL 150 MG PO CAPS: 150.0000 mg | ORAL_CAPSULE | Freq: Four times a day (QID) | ORAL | 0 refills | Status: DC

## 2019-06-19 MED ORDER — ACETAMINOPHEN-CODEINE 300-30 MG PO TABS: 1.0000 | ORAL_TABLET | Freq: Four times a day (QID) | ORAL | 0 refills | Status: DC | PRN

## 2019-06-19 NOTE — Discharge Instructions (Addendum)
It was very nice seeing you today in clinic. Thank you for entrusting me with your care.   Please utilize the medications that we discussed. Your prescriptions have been called in to your pharmacy. Warm salt water gargles may help with the pain and swelling in your mouth; will rid of "infection taste".    Make arrangements to follow up with [facility] ASAP. Call Monday. I made attempts to call for you today, but there was no answer. If your symptoms/condition worsens, please seek follow up care either here or in the ER. Please remember, our [place] providers are "right here with you" when you need [place].   Again, it was my pleasure to take care of you today. Thank you for choosing our clinic. I hope that you start to feel better quickly.   Honor Loh, MSN, APRN, FNP-C, CEN Advanced Practice Provider [facility]

## 2019-06-19 NOTE — ED Triage Notes (Signed)
Patient complains of dental pain that has been ongoing. Patient states that she went to the dentist last Friday. Patient states that she needs 8 teeth removed. She states that she has been saving her money for the teeth extraction and could only afford 6 teeth removed and the dentist wouldn't removed them unless it was all. Patient states that she was given an Rx for amoxicillin but no further help. Patient states that she has been taking the amoxicillin as prescribed. Patient woke up with dental swelling, redness and facial pain this morning.

## 2019-06-20 NOTE — ED Provider Notes (Signed)
[place], [place]   Name: Debbie Hendricks DOB: [birth year] MRN: [id number] CSN: [id number] PCP: Dola Argyle, MD  Arrival date and time:  06/19/19 1347  Chief Complaint:  Dental Pain   NOTE: Prior to seeing the patient today, I have reviewed the triage nursing documentation and vital signs. Clinical staff has updated patient's PMH/PSHx, current medication list, and drug allergies/intolerances to ensure comprehensive history available to assist in medical decision making.   History:   HPI: Debbie Hendricks is a 68 y.o. female who presents today with complaints of pain and swelling to the LEFT side of her face that began with acute onset this morning.  She denies any associated fevers.  Patient advising that she can "taste infection".  Swelling noted to LEFT infraorbital area and jaw. This is a recurrent condition for this patient due to poor dentition and multiple broken teeth.    Patient advising that she was seen by her dentist on 06/12/2019 for consultation regarding need for multiple dental extractions. Patient was seen by a Dr[facility] Ron[facility]d [facility] [place]. Patient advising that the dentist told her that she would pull all her teeth at once or "none at all".  Since admission patient was prescribed a 10-day course of amoxicillin.  Patient notes that she advised the dentist that amoxicillin has not worked for her in the past.  Patient states, "she told me to take the amoxicillin anyway and call her when I was ready to have her teeth pulled". Patient unhappy with the care she received thus she is refusing to return to see Dr. Ronnald Ramp.  Previous dental extractions performed at [facility], however patient has been unable to reach them on the phone.   Patient presents today very anxious regarding the rapid onset of her symptoms. She notes that she had difficulty sleeping last night due to her pain.   Past Medical History:  Diagnosis Date   Anxiety    Cancer ([place])     Femoral nerve injury    Hearing loss    right side   Hyperlipidemia    Hypertension    Thyroid disease     Past Surgical History:  Procedure Laterality Date   ABDOMINAL HYSTERECTOMY     ACOUSTIC NEUROMA RESECTION     BLADDER SURGERY     CARDIAC CATHETERIZATION     ECTOPIC PREGNANCY SURGERY      Family History  Problem Relation Age of Onset   Hypertension Mother    Thyroid disease Mother    Other Father 8       murdered    Social History   Tobacco Use   Smoking status: Current Some Day Smoker    Packs/day: 0.50    Types: Cigarettes   Smokeless tobacco: Never Used  Substance Use Topics   Alcohol use: No   Drug use: No    There are no active problems to display for this patient.   Home Medications:    Current Meds  Medication Sig   amLODipine (NORVASC) 5 MG tablet Take 5 mg by mouth daily.   aspirin 81 MG chewable tablet Chew by mouth daily.   atorvastatin (LIPITOR) 20 MG tablet Take 20 mg by mouth daily.   clonazePAM (KLONOPIN) 1 MG tablet Take 1 mg by mouth 3 (three) times daily as needed for anxiety.   fluticasone (FLONASE) 50 MCG/ACT nasal spray Place into both nostrils daily.   hydrOXYzine (ATARAX/VISTARIL) 25 MG tablet Take 1 tablet (25 mg total) by  mouth every 6 (six) hours.   levothyroxine (SYNTHROID, LEVOTHROID) 100 MCG tablet Take 125 mcg by mouth daily before breakfast.    losartan-hydrochlorothiazide (HYZAAR) 100-25 MG tablet Take 1 tablet by mouth daily.   meclizine (ANTIVERT) 25 MG tablet Take 1 tablet (25 mg total) by mouth 3 (three) times daily as needed for dizziness.   metFORMIN (GLUCOPHAGE) 500 MG tablet Take 1 pill daily twice daily for diabetes   montelukast (SINGULAIR) 10 MG tablet Take 10 mg by mouth at bedtime.   omeprazole (PRILOSEC) 40 MG capsule Take 40 mg by mouth daily.   pravastatin (PRAVACHOL) 10 MG tablet Take 10 mg by mouth daily.   SPIRIVA HANDIHALER 18 MCG inhalation capsule INHALE 1 CAPSULE VIA  HANDIHALER ONCE DAILY AT THE SAME TIME EVERY DAY   venlafaxine (EFFEXOR) 75 MG tablet Take 75 mg by mouth 2 (two) times daily with a meal.    Allergies:   Sulfa antibiotics, Amoxicillin, Gabapentin, and Morphine and related  Review of Systems (ROS): Review of Systems  Constitutional: Negative for chills and fever.  HENT: Positive for dental problem. Negative for drooling and trouble swallowing.   Eyes: Negative for visual disturbance.  Respiratory: Negative for cough and shortness of breath.   Cardiovascular: Negative for chest pain and palpitations.  Gastrointestinal: Negative for diarrhea, nausea and vomiting.  Skin: Positive for color change.  Neurological: Negative for dizziness, syncope, weakness, numbness and headaches.  Hematological: Negative for adenopathy.  Psychiatric/Behavioral: Positive for sleep disturbance. The patient is nervous/anxious.   All other systems reviewed and are negative.    Vital Signs: Today's Vitals   06/19/19 1414 06/19/19 1415 06/19/19 1418  BP:   110/88  Pulse:   73  Resp:   18  Temp:   98 F (36.7 C)  TempSrc:   Oral  SpO2:   97%  Weight:  183 lb (83 kg)   Height:  5' 7.5" (1.715 m)   PainSc: 10-Worst pain ever      Physical Exam: Physical Exam  Constitutional: She is oriented to person, place, and time and well-developed, well-nourished, and in no distress.  HENT:  Head: Normocephalic and atraumatic. Head is with left periorbital erythema.    Mouth/Throat: Mucous membranes are normal. Dental caries (Poor dentition overall.  Retained native teeth in ill repair; broken) present. No oropharyngeal exudate, posterior oropharyngeal edema or posterior oropharyngeal erythema.  Eyes: Pupils are equal, round, and reactive to light. EOM are normal.  Neck: Trachea normal, normal range of motion, full passive range of motion without pain and phonation normal. Neck supple.  Cardiovascular: Normal rate, regular rhythm, normal heart sounds and  intact distal pulses. Exam reveals no gallop and no friction rub.  No murmur heard. Pulmonary/Chest: Effort normal and breath sounds normal. No respiratory distress. She has no wheezes. She has no rales.  Neurological: She is alert and oriented to person, place, and time. Gait normal. GCS score is 15.  Skin: Skin is warm and dry. No rash noted.  Psychiatric: Memory, affect and judgment normal. Her mood appears anxious.  Nursing note and vitals reviewed.   Urgent Care Treatments / Results:   LABS: PLEASE NOTE: all labs that were ordered this encounter are listed, however only abnormal results are displayed. Labs Reviewed - No data to display  EKG: -None  RADIOLOGY: No results found.  PROCEDURES: Procedures  MEDICATIONS RECEIVED THIS VISIT: Medications - No data to display  PERTINENT CLINICAL COURSE NOTES/UPDATES:   Initial Impression / Assessment and Plan /  Urgent Care Course:  Pertinent labs & imaging results that were available during my care of the patient were personally reviewed by me and considered in my medical decision making (see lab/imaging section of note for values and interpretations).  Debbie Hendricks is a 69 y.o. female who presents to [facility] today with complaints of dental pain with associated facial swelling.   Patient is well appearing overall in clinic today. She does not appear to be in any acute distress. Presenting symptoms (see HPI) and exam as documented above.  Presenting symptoms and exam related to recurrent preseptal cellulitis of the LEFT eye.  Retained native teeth in very ill repair.  Patient needs total dental extraction to prevent recurrence of facial cellulitis.  Patient has been taking amoxicillin since 06/12/2019, however symptoms presented with acute onset earlier this morning.  Patient denies fevers.  Will treat with a seven-day course of clindamycin.  Patient requesting something for pain citing that she was has been unable to sleep.   She has used Tylenol and Motrin without any perceived benefit.  Patient unable to tolerate many opioid pain medications.  She notes that the only thing that has worked for her and that has been effective is Tylenol #3.  Requests reasonable.  Will send prescription for short-term course of Tylenol #3.  Patient needs to be seen for further evaluation by dentistry.  Patient upset with her current practice and refuses to return.  She has been seen by [facility] in the past and had multiple extractions performed.  I took the liberty of attempting to contact dentistry school for her today, however I was unable to get through. Name and office contact information provided on today's AVS for [facility] [facility].  Patient advised the she will need to contact the school to schedule an appointment to be seen ASAP.  I have reviewed the follow up and strict return precautions for any new or worsening symptoms. Patient is aware of symptoms that would be deemed urgent/emergent, and would thus require further evaluation either here or in the emergency department. At the time of discharge, she verbalized understanding and consent with the discharge plan as it was reviewed with her. All questions were fielded by provider and/or clinic staff prior to patient discharge.    Final Clinical Impressions / Urgent Care Diagnoses:   Final diagnoses:  Pain, dental  Preseptal cellulitis of left eye  Dental infection    New Prescriptions:  [place] Controlled Substance Registry consulted? Yes, I have consulted the [place] Controlled Substances Registry for this patient, and feel the risk/benefit ratio today is favorable for proceeding with this prescription for a controlled substance.   Discussed use of controlled substance medication to treat her acute pain.  o Reviewed [place] STOP Act regulations  o Clinic does not refill controlled substances over the phone without face to face evaluation.   Safety precautions reviewed.   o Medications should not be bitten, chewed, crushed, shared, or taken with alcohol.  o Avoid use while working, driving, or operating heavy machinery.  o Side effects associated with the use of this particular medication reviewed. - Patient understands that this medication can cause CNS depression, increase her risk of falls, and even lead to overdose that may result in death, if used outside of the parameters that she and I discussed.  With all of this in mind, she knowingly accepts the risks and responsibilities associated with intended course of treatment, and elects to responsibly proceed  as discussed.  Meds ordered this encounter  Medications   Acetaminophen-Codeine (TYLENOL/CODEINE #3) 300-30 MG tablet    Sig: Take 1 tablet by mouth every 6 (six) hours as needed for pain.    Dispense:  16 tablet    Refill:  0   clindamycin (CLEOCIN) 150 MG capsule    Sig: Take 1 capsule (150 mg total) by mouth every 6 (six) hours.    Dispense:  28 capsule    Refill:  0    Recommended Follow up Care:  Patient encouraged to follow up with the following provider within the specified time frame, or sooner as dictated by the severity of her symptoms. As always, she was instructed that for any urgent/emergent care needs, she should seek care either here or in the emergency department for more immediate evaluation.  Follow-up Information    Call  [facility] [place].   Specialty: Dentistry Why: Need to discuss extractions. Contact information: [address] [place] [address] [phone]         NOTE: This note was prepared using Dragon dictation software along with smaller phrase technology. Despite my best ability to proofread, there is the potential that transcriptional errors may still occur from this process, and are completely unintentional.     Karen Kitchens, NP 06/21/19 0002

## 2019-06-24 ENCOUNTER — Encounter: Payer: Self-pay | Admitting: Emergency Medicine

## 2019-06-24 ENCOUNTER — Other Ambulatory Visit: Payer: Self-pay

## 2019-06-24 ENCOUNTER — Ambulatory Visit
Admission: EM | Admit: 2019-06-24 | Discharge: 2019-06-24 | Disposition: A | Discharge status: Home / Self Care | Payer: Medicare HMO | Attending: Family Medicine | Admitting: Family Medicine

## 2019-06-24 DIAGNOSIS — K047 Periapical abscess without sinus: Secondary | ICD-10-CM | POA: Diagnosis not present

## 2019-06-24 MED ORDER — CLINDAMYCIN HCL 150 MG PO CAPS: 300.0000 mg | ORAL_CAPSULE | Freq: Three times a day (TID) | ORAL | 0 refills | Status: AC

## 2019-06-24 MED ORDER — HYDROCODONE-ACETAMINOPHEN 5-325 MG PO TABS: 1.0000 | ORAL_TABLET | Freq: Three times a day (TID) | ORAL | 0 refills | Status: DC | PRN

## 2019-06-24 MED ORDER — CEFTRIAXONE SODIUM 1 G IJ SOLR
1.0000 g | Freq: Once | INTRAMUSCULAR | Status: AC
  Administered 2019-06-24: 17:00:00 1 g via INTRAMUSCULAR

## 2019-06-24 NOTE — ED Provider Notes (Signed)
[facility]    CSN: [id number] Arrival date & time: 06/24/19  1619  History   Chief Complaint Chief Complaint  Patient presents with  . Dental Pain   HPI  68 year old female presents with the above complaint.  Patient recently seen at [facility] on 7/31. Placed on Clindamycin and given Tylenol w/ codeine for dental infection/preseptal cellulitis.   Patient reports that she is still having dental pain and swelling/redness below her left eye. She is out of prescribed medication. Was unable to have extraction (see previous note from encounter on 7/31). Pain is 9/10 in severity. No fever.  Patient believes infection is still present and is requesting additional medication today. No other associated symptoms.  PMH, Surgical Hx, Family Hx, Social History reviewed and updated as below.  PMH: Hypertension    Hyperlipidemia    Depression    GERD (gastroesophageal reflux disease)    PTSD (post-traumatic stress disorder)    TIA (transient ischemic attack)    Disease of thyroid gland    Stroke (CMS-HCC)    Migraine    History of transfusion    Anxiety    Tremor    Femoral nerve injury    Abnormal Pap smear of cervix 2017   Type 2 diabetes mellitus without complication, without long-term current use of insulin (CMS-HCC)     Surgical Hx: APPENDECTOMY      TONSILECTOMY, ADENOIDECTOMY, BILATERAL MYRINGOTOMY AND TUBES      ECTOPIC PREGNANCY SURGERY      bladder tack      BLADDER SURGERY      brain tumor removal      BREAST BIOPSY  Left    HYSTERECTOMY      OOPHORECTOMY       OB History   No obstetric history on file.      Home Medications    Prior to Admission medications   Medication Sig Start Date End Date Taking? Authorizing Provider  amLODipine (NORVASC) 5 MG tablet Take 5 mg by mouth daily.   Yes [provider]  aspirin 81 MG chewable tablet Chew by mouth daily.   Yes [provider]   atorvastatin (LIPITOR) 20 MG tablet Take 20 mg by mouth daily.   Yes [provider]  clonazePAM (KLONOPIN) 1 MG tablet Take 1 mg by mouth 3 (three) times daily as needed for anxiety.   Yes [provider]  fluticasone (FLONASE) 50 MCG/ACT nasal spray Place into both nostrils daily.   Yes [provider]  hydrOXYzine (ATARAX/VISTARIL) 25 MG tablet Take 1 tablet (25 mg total) by mouth every 6 (six) hours. 08/05/18  Yes Lorin Picket, PA-C  levothyroxine (SYNTHROID, LEVOTHROID) 100 MCG tablet Take 125 mcg by mouth daily before breakfast.    Yes [provider]  losartan-hydrochlorothiazide (HYZAAR) 100-25 MG tablet Take 1 tablet by mouth daily.   Yes [provider]  meclizine (ANTIVERT) 25 MG tablet Take 1 tablet (25 mg total) by mouth 3 (three) times daily as needed for dizziness. 05/14/17  Yes Harvest Dark, MD  metFORMIN (GLUCOPHAGE) 500 MG tablet Take 1 pill daily twice daily for diabetes 05/21/19 05/20/20 Yes [provider]  montelukast (SINGULAIR) 10 MG tablet Take 10 mg by mouth at bedtime.   Yes [provider]  omeprazole (PRILOSEC) 40 MG capsule Take 40 mg by mouth daily.   Yes [provider]  pravastatin (PRAVACHOL) 10 MG tablet Take 10 mg by mouth daily.   Yes [provider]  SPIRIVA HANDIHALER 18 MCG inhalation capsule INHALE 1 CAPSULE VIA HANDIHALER ONCE DAILY AT THE SAME TIME EVERY DAY 05/29/18  Yes [provider]  venlafaxine (EFFEXOR) 75 MG tablet Take 75 mg by mouth 2 (two) times daily with a meal.   Yes [provider]  clindamycin (CLEOCIN) 150 MG capsule Take 2 capsules (300 mg total) by mouth every 8 (eight) hours for 7 days. 06/24/19 07/01/19  Coral Spikes, DO  HYDROcodone-acetaminophen (NORCO/VICODIN) 5-325 MG tablet Take 1 tablet by mouth every 8 (eight) hours as needed. 06/24/19   Coral Spikes, DO    Family History Family History  Problem Relation Age of Onset  .  Hypertension Mother   . Thyroid disease Mother   . Other Father 76       murdered    Social History Social History   Tobacco Use  . Smoking status: Current Some Day Smoker    Packs/day: 0.50    Types: Cigarettes  . Smokeless tobacco: Never Used  Substance Use Topics  . Alcohol use: No  . Drug use: No     Allergies   Sulfa antibiotics, Amoxicillin, Gabapentin, and Morphine and related   Review of Systems Review of Systems  Constitutional: Negative for fever.  HENT: Positive for dental problem and facial swelling.    Physical Exam Triage Vital Signs ED Triage Vitals  Enc Vitals Group     BP 06/24/19 1649 107/62     Pulse Rate 06/24/19 1649 82     Resp 06/24/19 1649 18     Temp 06/24/19 1649 98.1 F (36.7 C)     Temp Source 06/24/19 1649 Oral     SpO2 06/24/19 1649 96 %     Weight 06/24/19 1650 182 lb (82.6 kg)     Height 06/24/19 1650 5\' 7"  (1.702 m)     Head Circumference --      Peak Flow --      Pain Score 06/24/19 1650 9     Pain Loc --      Pain Edu? --      Excl. in [place]? --    No data found.  Updated Vital Signs BP 107/62 (BP Location: Right Arm)   Pulse 82   Temp 98.1 F (36.7 C) (Oral)   Resp 18   Ht 5\' 7"  (1.702 m)   Wt 82.6 kg   SpO2 96%   BMI 28.51 kg/m   Visual Acuity Right Eye Distance:   Left Eye Distance:   Bilateral Distance:    Right Eye Near:   Left Eye Near:    Bilateral Near:     Physical Exam Vitals signs and nursing note reviewed.  Constitutional:      Comments: Chronically ill appearing female in no acute distress.  HENT:     Head: Normocephalic and atraumatic.     Comments: Swelling and erythema noted below the left eye. Mild tenderness to palpation.    Mouth/Throat:     Comments: Upper dentition with decaying teeth which are all broken. Gum swelling and erythema noted. Eyes:     General:        Right eye: No discharge.        Left eye: No discharge.     Conjunctiva/sclera: Conjunctivae normal.  Cardiovascular:      Rate and Rhythm: Normal rate and regular rhythm.  Pulmonary:     Effort: Pulmonary effort is normal. No respiratory distress.  Psychiatric:  Behavior: Behavior normal.     Comments: Anxious.    UC Treatments / Results  Labs (all labs ordered are listed, but only abnormal results are displayed) Labs Reviewed - No data to display  EKG   Radiology No results found.  Procedures Procedures (including critical care time)  Medications Ordered in UC Medications  cefTRIAXone (ROCEPHIN) injection 1 g (1 g Intramuscular Given 06/24/19 1721)    Initial Impression / Assessment and Plan / UC Course  I have reviewed the triage vital signs and the nursing notes.  Pertinent labs & imaging results that were available during my care of the patient were reviewed by me and considered in my medical decision making (see chart for details).    68 year old female presents with dental infection. Rocephin given today. Additional course of Clindamycin. Needs to have this addressed by dentist. PRN Vicodin for pain. [place] controlled substance database reviewed today.   Final Clinical Impressions(s) / UC Diagnoses   Final diagnoses:  Dental infection     Discharge Instructions     Medications as prescribed.  Follow up with dentist.  Take care  Dr. Lacinda Axon    ED Prescriptions    Medication Sig [place]. Provider   clindamycin (CLEOCIN) 150 MG capsule Take 2 capsules (300 mg total) by mouth every 8 (eight) hours for 7 days. 42 capsule Yasiel Goyne G, DO   HYDROcodone-acetaminophen (NORCO/VICODIN) 5-325 MG tablet Take 1 tablet by mouth every 8 (eight) hours as needed. 10 tablet Coral Spikes, DO     Controlled Substance Prescriptions [place] Controlled Substance Registry consulted? Yes, I have consulted the [place] Controlled Substances Registry for this patient, and feel the risk/benefit ratio today is favorable for proceeding with this prescription for a controlled substance.   Coral Spikes, [place] 06/24/19 2215

## 2019-06-24 NOTE — ED Triage Notes (Signed)
Patient here for continued swelling in her face and pain in the left side of her face. She is requesting more antibiotics and pain medication.

## 2019-06-24 NOTE — Discharge Instructions (Signed)
Medications as prescribed.  Follow up with dentist.  Take care  Dr. Lacinda Axon

## 2019-10-13 ENCOUNTER — Ambulatory Visit (INDEPENDENT_AMBULATORY_CARE_PROVIDER_SITE_OTHER): Payer: Medicare HMO

## 2019-10-13 ENCOUNTER — Other Ambulatory Visit: Payer: Self-pay

## 2019-10-13 ENCOUNTER — Ambulatory Visit
Admission: EM | Admit: 2019-10-13 | Discharge: 2019-10-13 | Disposition: A | Discharge status: Home / Self Care | Payer: Medicare HMO | Attending: Family Medicine | Admitting: Family Medicine

## 2019-10-13 ENCOUNTER — Encounter: Payer: Self-pay | Admitting: Emergency Medicine

## 2019-10-13 DIAGNOSIS — J0100 Acute maxillary sinusitis, unspecified: Secondary | ICD-10-CM

## 2019-10-13 DIAGNOSIS — Z03818 Encounter for observation for suspected exposure to other biological agents ruled out: Secondary | ICD-10-CM | POA: Diagnosis not present

## 2019-10-13 DIAGNOSIS — J42 Unspecified chronic bronchitis: Secondary | ICD-10-CM

## 2019-10-13 DIAGNOSIS — R05 Cough: Secondary | ICD-10-CM

## 2019-10-13 DIAGNOSIS — R059 Cough, unspecified: Secondary | ICD-10-CM

## 2019-10-13 MED ORDER — DOXYCYCLINE HYCLATE 100 MG PO CAPS: 100.0000 mg | ORAL_CAPSULE | Freq: Two times a day (BID) | ORAL | 0 refills | Status: DC

## 2019-10-13 MED ORDER — PSEUDOEPH-BROMPHEN-DM 30-2-10 MG/5ML PO SYRP: 10.0000 mL | ORAL_SOLUTION | Freq: Four times a day (QID) | ORAL | 0 refills | Status: DC | PRN

## 2019-10-13 NOTE — ED Triage Notes (Signed)
Pt c/o cough, sinus congestion, post nasal drip, headache, sinus pain and pressure. Started over a month ago.

## 2019-10-13 NOTE — ED Provider Notes (Signed)
[facility]    CSN: [id number] Arrival date & time: 10/13/19  1301      History   Chief Complaint Chief Complaint  Patient presents with  . Cough    HPI Debbie Hendricks is a 68 y.o. female presenting for 1.5 week of nasal congestion and sinus pain/pressure, headaches, cough, and fatigue. She has a history of COPD and diabetes mellitus type 2. Patient denies fever, sore throat, chest pain, breathing difficulty. No known COVID exposure. She says she just feels really ill and rundown and symptoms are getting worse. Admits to sweats at night and in the morning. Cough is occasionally productive of clear mucus. Patient has tried OTC decongestants w/o relief. No other concerns/symptoms today.  HPI  Past Medical History:  Diagnosis Date  . Anxiety   . Cancer ([place])   . Femoral nerve injury   . Hearing loss    right side  . Hyperlipidemia   . Hypertension   . Thyroid disease     There are no active problems to display for this patient.   Past Surgical History:  Procedure Laterality Date  . ABDOMINAL HYSTERECTOMY    . ACOUSTIC NEUROMA RESECTION    . BLADDER SURGERY    . CARDIAC CATHETERIZATION    . ECTOPIC PREGNANCY SURGERY      OB History   No obstetric history on file.      Home Medications    Prior to Admission medications   Medication Sig Start Date End Date Taking? Authorizing Provider  amLODipine (NORVASC) 5 MG tablet Take 5 mg by mouth daily.   Yes [provider]  aspirin 81 MG chewable tablet Chew by mouth daily.   Yes [provider]  atorvastatin (LIPITOR) 20 MG tablet Take 20 mg by mouth daily.   Yes [provider]  clonazePAM (KLONOPIN) 1 MG tablet Take 1 mg by mouth 3 (three) times daily as needed for anxiety.   Yes [provider]  fluticasone (FLONASE) 50 MCG/ACT nasal spray Place into both nostrils daily.   Yes [provider]  HYDROcodone-acetaminophen (NORCO/VICODIN) 5-325 MG tablet Take 1  tablet by mouth every 8 (eight) hours as needed. 06/24/19  Yes Cook, Jayce G, DO  hydrOXYzine (ATARAX/VISTARIL) 25 MG tablet Take 1 tablet (25 mg total) by mouth every 6 (six) hours. 08/05/18  Yes Lorin Picket, PA-C  levothyroxine (SYNTHROID, LEVOTHROID) 100 MCG tablet Take 125 mcg by mouth daily before breakfast.    Yes [provider]  losartan-hydrochlorothiazide (HYZAAR) 100-25 MG tablet Take 1 tablet by mouth daily.   Yes [provider]  meclizine (ANTIVERT) 25 MG tablet Take 1 tablet (25 mg total) by mouth 3 (three) times daily as needed for dizziness. 05/14/17  Yes Harvest Dark, MD  metFORMIN (GLUCOPHAGE) 500 MG tablet Take 1 pill daily twice daily for diabetes 05/21/19 05/20/20 Yes [provider]  montelukast (SINGULAIR) 10 MG tablet Take 10 mg by mouth at bedtime.   Yes [provider]  omeprazole (PRILOSEC) 40 MG capsule Take 40 mg by mouth daily.   Yes [provider]  pravastatin (PRAVACHOL) 10 MG tablet Take 10 mg by mouth daily.   Yes [provider]  SPIRIVA HANDIHALER 18 MCG inhalation capsule INHALE 1 CAPSULE VIA HANDIHALER ONCE DAILY AT THE SAME TIME EVERY DAY 05/29/18  Yes [provider]  venlafaxine (EFFEXOR) 75 MG tablet Take 75 mg by mouth 2 (two) times daily with a meal.   Yes [provider]  brompheniramine-pseudoephedrine-DM 30-2-10 MG/5ML syrup Take 10 mLs by mouth 4 (four) times daily as needed for up to 7 days. 10/13/19 10/20/19  Laurene Footman B, PA-C  doxycycline (VIBRAMYCIN) 100 MG capsule Take 1 capsule (100 mg total) by mouth 2 (two) times daily for 7 days. 10/13/19 10/20/19  Danton Clap, PA-C    Family History Family History  Problem Relation Age of Onset  . Hypertension Mother   . Thyroid disease Mother   . Other Father 18       murdered    Social History Social History   Tobacco Use  . Smoking status: Current Some Day Smoker    Packs/day: 0.50    Types: Cigarettes  .  Smokeless tobacco: Never Used  Substance Use Topics  . Alcohol use: No  . Drug use: No     Allergies   Sulfa antibiotics, Amoxicillin, Gabapentin, and Morphine and related   Review of Systems Review of Systems  Constitutional: Positive for diaphoresis and fatigue. Negative for fever.  HENT: Positive for congestion, rhinorrhea, sinus pressure and sinus pain. Negative for ear pain and sore throat.   Respiratory: Positive for cough and wheezing. Negative for chest tightness and shortness of breath.   Cardiovascular: Negative for chest pain.  Gastrointestinal: Negative for abdominal pain, nausea and vomiting.  Musculoskeletal: Negative for arthralgias and myalgias.  Neurological: Positive for headaches. Negative for weakness and light-headedness.     Physical Exam Triage Vital Signs ED Triage Vitals  Enc Vitals Group     BP 10/13/19 1337 136/82     Pulse Rate 10/13/19 1337 66     Resp 10/13/19 1337 18     Temp 10/13/19 1337 98.4 F (36.9 C)     Temp Source 10/13/19 1337 Oral     SpO2 10/13/19 1337 94 %     Weight 10/13/19 1331 165 lb (74.8 kg)     Height 10/13/19 1331 5\' 8"  (1.727 m)     Head Circumference --      Peak Flow --      Pain Score 10/13/19 1331 9     Pain Loc --      Pain Edu? --      Excl. in [place]? --    No data found.  Updated Vital Signs BP 136/82 (BP Location: Left Arm)   Pulse 66   Temp 98.4 F (36.9 C) (Oral)   Resp 18   Ht 5\' 8"  (1.727 m)   Wt 165 lb (74.8 kg)   SpO2 94%   BMI 25.09 kg/m   Physical Exam Vitals signs and nursing note reviewed.  Constitutional:      General: She is not in acute distress.    Appearance: Normal appearance. She is normal weight. She is not ill-appearing or toxic-appearing.  HENT:     Head: Normocephalic and atraumatic.     Right Ear: Tympanic membrane, ear canal and external ear normal.     Left Ear: Tympanic membrane, ear canal and external ear normal.     Nose: Congestion and rhinorrhea (trace purulent,  nasal mucosal edema) present.     Mouth/Throat:     Mouth: Mucous membranes are moist.     Pharynx: Oropharynx is clear. Posterior oropharyngeal erythema (mild erythema and clear PND) present.  Eyes:     General: No scleral icterus.       Right eye: No discharge.        Left eye: No discharge.     Conjunctiva/sclera: Conjunctivae normal.  Pupils: Pupils are equal, round, and reactive to light.  Cardiovascular:     Rate and Rhythm: Normal rate and regular rhythm.     Heart sounds: Normal heart sounds. No murmur.  Pulmonary:     Effort: Pulmonary effort is normal. No respiratory distress.     Breath sounds: Wheezing (diffuse wheezing throughout entire chest) present. No rhonchi.  Skin:    General: Skin is warm and dry.     Findings: No rash.  Neurological:     General: No focal deficit present.     Mental Status: She is alert. Mental status is at baseline.     Motor: No weakness.     Gait: Gait normal.  Psychiatric:        Mood and Affect: Mood normal.        Behavior: Behavior normal.        Thought Content: Thought content normal.      UC Treatments / Results  Labs (all labs ordered are listed, but only abnormal results are displayed) Labs Reviewed  NOVEL CORONAVIRUS, NAA (HOSP ORDER, SEND-OUT TO REF LAB; TAT 18-24 HRS)    EKG   Radiology Dg Chest 2 View  Result Date: 10/13/2019 CLINICAL DATA:  Cough and wheezing. EXAM: CHEST - 2 VIEW COMPARISON:  Chest x-ray dated September 23, 2010. FINDINGS: The heart size and mediastinal contours are within normal limits. Normal pulmonary vascularity. The lungs remain hyperinflated. Biapical pleuroparenchymal scarring. No focal consolidation, pleural effusion, or pneumothorax. No acute osseous abnormality. IMPRESSION: No active cardiopulmonary disease. Electronically Signed   By: Titus Dubin M.D.   On: 10/13/2019 14:52    Procedures Procedures (including critical care time)  Medications Ordered in UC Medications - No data  to display  Initial Impression / Assessment and Plan / UC Course  I have reviewed the triage vital signs and the nursing notes.  Pertinent labs & imaging results that were available during my care of the patient were reviewed by me and considered in my medical decision making (see chart for details).    Patient with history of COPD presenting with change in cough and wheezing on exam as well as 94% O2 saturation. Chest x-ray does not indicate pneumonia or other acute chest process. Advised patient she should continue inhalers as directed. May take prescribed cough syrup or Mucinex. Increase fluids. Offered prednisone but she declined. COVID testing sent off and patient educated about the topic and how to access results. Discussed ER precautions.  Patient has symptoms consistent with sinusitis. Treating with doxycycline at this time to cover pathogens most common in sinus infections and any bacterial bronchitis. Discussed treatment of sinus infection further and when to see re-evaluation.   Final Clinical Impressions(s) / UC Diagnoses   Final diagnoses:  Acute maxillary sinusitis, recurrence not specified  Cough  Encntr for obs for susp expsr to oth biolg agents ruled out  Chronic bronchitis, unspecified chronic bronchitis type [facility])     Discharge Instructions     SINUSITIS: Your symptoms are consistent with sinus infection. Reviewed use of a nasal saline irrigation system. May consider use of intranasal steroids, such as Flonase as well. Use medications as directed. If antibiotics are prescribed, take the full course of antibiotics. Take prescribed cough medication. Increase rest, fluids. If you do not improve or if you worsen after a course of antibiotics, you should be re-examined. You may need a different antibiotic or further evaluation with imaging or an exam of the inside of the sinuses  CHRONIC BRONCHITIS: Imaging negative today for pneumonia. Take cough medication throughout the  day and drink plenty of fluids to break up the mucus . Take cough syrup at bedtime if needed, for cough and to assist with sleep. You have declined prednisone today. Increase rest and fluid intake. Return to the clinic, your PCP, or ER if you have any new/ worsening symptoms such as fever,  chest pain, difficulty breathing, worsening cough, mental status changes, lethargy, etc.          ED Prescriptions    Medication Sig Dispense Auth. Provider   doxycycline (VIBRAMYCIN) 100 MG capsule Take 1 capsule (100 mg total) by mouth 2 (two) times daily for 7 days. 14 capsule Laurene Footman B, PA-C   brompheniramine-pseudoephedrine-DM 30-2-10 MG/5ML syrup Take 10 mLs by mouth 4 (four) times daily as needed for up to 7 days. 120 mL Danton Clap, PA-C     PDMP not reviewed this encounter.   Danton Clap, PA-C 10/13/19 1636

## 2019-10-13 NOTE — Discharge Instructions (Addendum)
SINUSITIS: Your symptoms are consistent with sinus infection. Reviewed use of a nasal saline irrigation system. May consider use of intranasal steroids, such as Flonase as well. Use medications as directed. If antibiotics are prescribed, take the full course of antibiotics. Take prescribed cough medication. Increase rest, fluids. If you do not improve or if you worsen after a course of antibiotics, you should be re-examined. You may need a different antibiotic or further evaluation with imaging or an exam of the inside of the sinuses   CHRONIC BRONCHITIS: Imaging negative today for pneumonia. Take cough medication throughout the day and drink plenty of fluids to break up the mucus . Take cough syrup at bedtime if needed, for cough and to assist with sleep. You have declined prednisone today. Increase rest and fluid intake. Return to the clinic, your PCP, or ER if you have any new/ worsening symptoms such as fever,  chest pain, difficulty breathing, worsening cough, mental status changes, lethargy, etc.

## 2019-10-14 LAB — NOVEL CORONAVIRUS, NAA (HOSP ORDER, SEND-OUT TO REF LAB; TAT 18-24 HRS): SARS-CoV-2, NAA: NOT DETECTED

## 2019-10-20 ENCOUNTER — Other Ambulatory Visit: Payer: Self-pay

## 2019-10-20 ENCOUNTER — Ambulatory Visit
Admission: EM | Admit: 2019-10-20 | Discharge: 2019-10-20 | Disposition: A | Discharge status: Home / Self Care | Payer: Medicare HMO | Attending: Urgent Care | Admitting: Urgent Care

## 2019-10-20 ENCOUNTER — Encounter: Payer: Self-pay | Admitting: Emergency Medicine

## 2019-10-20 DIAGNOSIS — R059 Cough, unspecified: Secondary | ICD-10-CM

## 2019-10-20 DIAGNOSIS — J0100 Acute maxillary sinusitis, unspecified: Secondary | ICD-10-CM

## 2019-10-20 DIAGNOSIS — R05 Cough: Secondary | ICD-10-CM

## 2019-10-20 MED ORDER — LEVOFLOXACIN 750 MG PO TABS: 750.0000 mg | ORAL_TABLET | Freq: Every day | ORAL | 0 refills | Status: DC

## 2019-10-20 MED ORDER — PSEUDOEPH-BROMPHEN-DM 30-2-10 MG/5ML PO SYRP: 5.0000 mL | ORAL_SOLUTION | Freq: Four times a day (QID) | ORAL | 0 refills | Status: DC | PRN

## 2019-10-20 NOTE — Discharge Instructions (Signed)
It was very nice seeing you today in clinic. Thank you for entrusting me with your care.   Rest and increase fluid intake. Use medications as prescribed. May use Tylenol as needed for fever, body aches, and headaches.   Make arrangements to follow up with your regular doctor in 1 week for re-evaluation if not improving. If your symptoms/condition worsens, please seek follow up care either here or in the ER. Please remember, our [place] providers are "right here with you" when you need [place].   Again, it was my pleasure to take care of you today. Thank you for choosing our clinic. I hope that you start to feel better quickly.   Honor Loh, MSN, APRN, FNP-C, CEN Advanced Practice Provider [facility]

## 2019-10-20 NOTE — ED Triage Notes (Signed)
Patient reports 2 weeks of nasal congestion and sinus pain/pressure, headaches, cough, and fatigue. She was seen on 11/24, states she felt better but then she started to feel worse on Monday. She states she was told to come back if she started feeling bad again.

## 2019-10-21 NOTE — ED Provider Notes (Addendum)
Debbie Hendricks, [place]   Name: Debbie Hendricks DOB: [birth year] MRN: [id number] CSN: [id number] PCP: Dola Argyle, MD  Arrival date and time:  10/20/19 1231  Chief Complaint:  Cough and Nasal Congestion   NOTE: Prior to seeing the patient today, I have reviewed the triage nursing documentation and vital signs. Clinical staff has updated patient's PMH/PSHx, current medication list, and drug allergies/intolerances to ensure comprehensive history available to assist in medical decision making.   History:   HPI: Debbie Hendricks is a 68 y.o. female who presents today with complaints of a 2 week history of cough, congestion, paranasal sinus tenderness, and fatigue. She denies any associated fevers. Patient was seen here on 10/13/2019 by Lelon Huh; notes reviewed. SARS-CoV-2 (novel coronavirus) testing and CXR negative. Patient was treated for acute maxillary sinusitis with a 7 day course of doxycycline. She was given Bromfed DM for her cough. Patient reports that she took the medications as directed and was feeling better, however once she completed her medications yesterday, she began to feel bad again. Patient presents today reporting that she is having the same symptoms as before. Cough remains significant and is noted to be worse when supine. She reports episodes of post-tussive emesis. She denies any recent exposures to anyone known to be ill. Patient does not wish to be re-tested for SARS-CoV-2.   Past Medical History:  Diagnosis Date  . Anxiety   . Cancer ([place])   . Femoral nerve injury   . Hearing loss    right side  . Hyperlipidemia   . Hypertension   . Thyroid disease     Past Surgical History:  Procedure Laterality Date  . ABDOMINAL HYSTERECTOMY    . ACOUSTIC NEUROMA RESECTION    . BLADDER SURGERY    . CARDIAC CATHETERIZATION    . ECTOPIC PREGNANCY SURGERY      Family History  Problem Relation Age of Onset  . Hypertension Mother   . Thyroid disease Mother   . Other Father 9        murdered    Social History   Tobacco Use  . Smoking status: Current Some Day Smoker    Packs/day: 0.50    Types: Cigarettes  . Smokeless tobacco: Never Used  Substance Use Topics  . Alcohol use: No  . Drug use: No    There are no active problems to display for this patient.   Home Medications:    Current Meds  Medication Sig  . amLODipine (NORVASC) 5 MG tablet Take 5 mg by mouth daily.  Marland Kitchen aspirin 81 MG chewable tablet Chew by mouth daily.  Marland Kitchen atorvastatin (LIPITOR) 20 MG tablet Take 20 mg by mouth daily.  . clonazePAM (KLONOPIN) 1 MG tablet Take 1 mg by mouth 3 (three) times daily as needed for anxiety.  . fluticasone (FLONASE) 50 MCG/ACT nasal spray Place into both nostrils daily.  Marland Kitchen levothyroxine (SYNTHROID, LEVOTHROID) 100 MCG tablet Take 125 mcg by mouth daily before breakfast.   . losartan-hydrochlorothiazide (HYZAAR) 100-25 MG tablet Take 1 tablet by mouth daily.  . meclizine (ANTIVERT) 25 MG tablet Take 1 tablet (25 mg total) by mouth 3 (three) times daily as needed for dizziness.  . metFORMIN (GLUCOPHAGE) 500 MG tablet Take 1 pill daily twice daily for diabetes  . montelukast (SINGULAIR) 10 MG tablet Take 10 mg by mouth at bedtime.  . pravastatin (PRAVACHOL) 10 MG tablet Take 10 mg by mouth daily.  Marland Kitchen SPIRIVA HANDIHALER 18 MCG inhalation capsule INHALE 1 CAPSULE  VIA HANDIHALER ONCE DAILY AT THE SAME TIME EVERY DAY  . venlafaxine (EFFEXOR) 75 MG tablet Take 75 mg by mouth 2 (two) times daily with a meal.  . [DISCONTINUED] brompheniramine-pseudoephedrine-DM 30-2-10 MG/5ML syrup Take 10 mLs by mouth 4 (four) times daily as needed for up to 7 days.    Allergies:   Sulfa antibiotics, Amoxicillin, Gabapentin, and Morphine and related  Review of Systems (ROS): Review of Systems  Constitutional: Positive for fatigue. Negative for fever.  HENT: Positive for congestion, sinus pressure, sinus pain and sore throat. Negative for ear pain, postnasal drip, rhinorrhea and  sneezing.   Eyes: Negative for pain, discharge and redness.  Respiratory: Positive for cough. Negative for chest tightness and shortness of breath.   Cardiovascular: Negative for chest pain and palpitations.  Gastrointestinal: Positive for vomiting (post-tussive). Negative for abdominal pain, diarrhea and nausea.  Musculoskeletal: Negative for arthralgias, back pain, myalgias and neck pain.  Skin: Negative for color change, pallor and rash.  Neurological: Positive for headaches. Negative for dizziness, syncope and weakness.  Hematological: Negative for adenopathy.     Vital Signs: Today's Vitals   10/20/19 1305 10/20/19 1306 10/20/19 1310 10/20/19 1425  BP:   95/81   Pulse:   100   Resp:   18   Temp:   98.6 F (37 C)   TempSrc:   Oral   SpO2:   99%   Weight:  162 lb (73.5 kg)    Height:  5\' 7"  (1.702 m)    PainSc: 0-No pain   0-No pain    Physical Exam: Physical Exam  Constitutional: She is oriented to person, place, and time and well-developed, well-nourished, and in no distress. No distress.  HENT:  Head: Normocephalic and atraumatic.  Right Ear: Tympanic membrane normal.  Left Ear: Tympanic membrane normal.  Nose: Mucosal edema, rhinorrhea and sinus tenderness present.  Mouth/Throat: Uvula is midline and mucous membranes are normal. Posterior oropharyngeal erythema present. No oropharyngeal exudate or posterior oropharyngeal edema.  Eyes: Pupils are equal, round, and reactive to light.  Neck: Normal range of motion. Neck supple.  Cardiovascular: Normal rate, regular rhythm, normal heart sounds and intact distal pulses.  Pulmonary/Chest: Effort normal. No respiratory distress. She has no decreased breath sounds. She has no wheezes. She has no rhonchi. She has no rales.  Deep cough in clinic. SPO2 99% on RA.  Abdominal: Soft. Normal appearance and bowel sounds are normal. She exhibits no distension. There is no abdominal tenderness.  Musculoskeletal: Normal range of motion.   Neurological: She is alert and oriented to person, place, and time. Gait normal.  Skin: Skin is warm and dry. No rash noted. She is not diaphoretic.  Psychiatric: Mood, memory, affect and judgment normal.  Nursing note and vitals reviewed.   Urgent Care Treatments / Results:   LABS: PLEASE NOTE: all labs that were ordered this encounter are listed, however only abnormal results are displayed. Labs Reviewed - No data to display  EKG: -None  RADIOLOGY: No results found.  PROCEDURES: Procedures  MEDICATIONS RECEIVED THIS VISIT: Medications - No data to display  PERTINENT CLINICAL COURSE NOTES/UPDATES:   Initial Impression / Assessment and Plan / Urgent Care Course:  Pertinent labs & imaging results that were available during my care of the patient were personally reviewed by me and considered in my medical decision making (see lab/imaging section of note for values and interpretations).  NOELLY THROWER is a 68 y.o. female who presents to [facility] today  with complaints of Cough and Nasal Congestion   Patient is well appearing overall in clinic today. She does not appear to be in any acute distress. Presenting symptoms (see HPI) and exam as documented above. Patient with continued symptoms. She initially improved following antibiotics and cough medication, however once medications completed her symptoms abruptly rebounded. Patient does not wish to be re-tested for SARS-CoV-2 (novel coronavirus) today citing that she improved with treatment and that she has not been around anyone. Discussed change in therapy. Patient has multiple allergies. Will send home with a prescription for a 5 day course of levofloxacin. Offered to change anti-tussive medication, however patient notes that what she was on has been working and she would like to have it refilled. Rx send per patient request. Discussed continued supportive care measures at home. Patient to rest as much as possible. She was  encouraged to ensure adequate hydration (water and ORS) to prevent dehydration and electrolyte derangements. Patient may use APAP and/or IBU on an as needed basis for pain/fever.   Discussed follow up with primary care physician in 1 week for re-evaluation. I have reviewed the follow up and strict return precautions for any new or worsening symptoms. Patient is aware of symptoms that would be deemed urgent/emergent, and would thus require further evaluation either here or in the emergency department. At the time of discharge, she verbalized understanding and consent with the discharge plan as it was reviewed with her. All questions were fielded by provider and/or clinic staff prior to patient discharge.    ADDENDUM: Patient returned call to the clinic once she arrived to the pharmacy. After speaking with the pharmacist, she states, "he scared me when he told me there were interactions between the antibiotic and by Effexor. I want to stick with what I was on since it was working".  Pharmacy notified to extend doxycycline for another 3 days (Disp #6), which will allow for a full 10 day course of therapy. Patient to return call to the clinic if not improving.   Final Clinical Impressions / Urgent Care Diagnoses:   Final diagnoses:  Acute maxillary sinusitis, recurrence not specified  Cough    New Prescriptions:  [place] Controlled Substance Registry consulted? Not Applicable  Meds ordered this encounter  Medications  . DISCONTD: levofloxacin (LEVAQUIN) 750 MG tablet    Sig: Take 1 tablet (750 mg total) by mouth daily for 5 days.    Dispense:  5 tablet    Refill:  0  . brompheniramine-pseudoephedrine-DM 30-2-10 MG/5ML syrup    Sig: Take 5 mLs by mouth 4 (four) times daily as needed.    Dispense:  120 mL    Refill:  0    Recommended Follow up Care:  Patient encouraged to follow up with the following provider within the specified time frame, or sooner as dictated by the severity of her symptoms. As  always, she was instructed that for any urgent/emergent care needs, she should seek care either here or in the emergency department for more immediate evaluation.  Follow-up Information    Dola Argyle, MD In 1 week.   Specialty: Family Medicine Why: General reassessment of symptoms if not improving Contact information: [place] [id number] [phone]         NOTE: This note was prepared using Dragon dictation software along with smaller phrase technology. Despite my best ability to proofread, there is the potential that transcriptional errors may still occur from this process, and are completely unintentional.  Karen Kitchens, NP 10/21/19 [phone]

## 2019-11-15 ENCOUNTER — Encounter: Payer: Self-pay | Admitting: Emergency Medicine

## 2019-11-15 ENCOUNTER — Ambulatory Visit
Admission: EM | Admit: 2019-11-15 | Discharge: 2019-11-15 | Disposition: A | Discharge status: Home / Self Care | Payer: Medicare HMO | Attending: Emergency Medicine | Admitting: Emergency Medicine

## 2019-11-15 ENCOUNTER — Other Ambulatory Visit: Payer: Self-pay

## 2019-11-15 DIAGNOSIS — K047 Periapical abscess without sinus: Secondary | ICD-10-CM

## 2019-11-15 DIAGNOSIS — K0889 Other specified disorders of teeth and supporting structures: Secondary | ICD-10-CM | POA: Diagnosis not present

## 2019-11-15 MED ORDER — CLINDAMYCIN HCL 300 MG PO CAPS: 300.0000 mg | ORAL_CAPSULE | Freq: Four times a day (QID) | ORAL | 0 refills | Status: AC

## 2019-11-15 MED ORDER — DICLOFENAC SODIUM 25 MG PO TBEC: 25.0000 mg | DELAYED_RELEASE_TABLET | Freq: Two times a day (BID) | ORAL | 0 refills | Status: AC

## 2019-11-15 MED ORDER — HYDROCODONE-ACETAMINOPHEN 5-325 MG PO TABS: 2.0000 | ORAL_TABLET | ORAL | 0 refills | Status: DC | PRN

## 2019-11-15 NOTE — ED Triage Notes (Signed)
Patient c/o dental pain in her bottom gums and reports swelling in her lower gums that started yesterday.  Patient denies fevers.

## 2019-11-15 NOTE — Discharge Instructions (Addendum)
You are being treated for dental infection.   Take the antibiotics as prescribed until they're finished. If you think you're having a reaction, stop the medication, take benadryl and go to the nearest urgent care/emergency room. Take a probiotic while taking the antibiotic to decrease the chances of stomach upset.   Take your anti-inflammatory medication as prescribed. You narcotic medication is only for breakthrough pain and will not would be refilled.  Follow-up with the dentist as soon as you can.

## 2019-11-15 NOTE — ED Provider Notes (Signed)
[facility] - [place], [place]   Name: Debbie Hendricks DOB: 03-12-1951 MRN: [id number] CSN: [id number] PCP: Dola Argyle, MD  Arrival date and time:  11/15/19 0848  Chief Complaint:  Dental Pain   NOTE: Prior to seeing the patient today, I have reviewed the triage nursing documentation and vital signs. Clinical staff has updated patient's PMH/PSHx, current medication list, and drug allergies/intolerances to ensure comprehensive history available to assist in medical decision making.   History:   HPI: Debbie Hendricks is a 68 y.o. female who presents today with complaints of dental pain.  States the pain started approximately 2 days ago, and has since been increasing.  She is try to treat the pain with her previously prescribed tramadol but that has not helped much.  Her last encounter with dental pain was earlier this year in August.  She was seen by a provider at this facility was treated with clindamycin and hydrocodone acetaminophen 03/21/2024.  She states she did get better after that regimen. Since that encounter she has been treated for recurrent sinus infection with doxycycline.   At this visit she states her pain is an excruciating 9 out of 10.  She has not been able to eat drink or sleep due to this pain.  Denies any fevers chills or increased body aches.  She also denies any drainage to her gums.  Past Medical History:  Diagnosis Date  . Anxiety   . Cancer ([place])   . Femoral nerve injury   . Hearing loss    right side  . Hyperlipidemia   . Hypertension   . Thyroid disease     Past Surgical History:  Procedure Laterality Date  . ABDOMINAL HYSTERECTOMY    . ACOUSTIC NEUROMA RESECTION    . BLADDER SURGERY    . CARDIAC CATHETERIZATION    . ECTOPIC PREGNANCY SURGERY      Family History  Problem Relation Age of Onset  . Hypertension Mother   . Thyroid disease Mother   . Other Father 19       murdered    Social History   Tobacco Use  . Smoking status:  Current Some Day Smoker    Packs/day: 0.50    Types: Cigarettes  . Smokeless tobacco: Never Used  Substance Use Topics  . Alcohol use: No  . Drug use: No    There are no problems to display for this patient.   Home Medications:    Current Meds  Medication Sig  . amLODipine (NORVASC) 5 MG tablet Take 5 mg by mouth daily.  Marland Kitchen aspirin 81 MG chewable tablet Chew by mouth daily.  Marland Kitchen atorvastatin (LIPITOR) 20 MG tablet Take 20 mg by mouth daily.  . brompheniramine-pseudoephedrine-DM 30-2-10 MG/5ML syrup Take 5 mLs by mouth 4 (four) times daily as needed.  . clonazePAM (KLONOPIN) 1 MG tablet Take 1 mg by mouth 3 (three) times daily as needed for anxiety.  . fluticasone (FLONASE) 50 MCG/ACT nasal spray Place into both nostrils daily.  Marland Kitchen levothyroxine (SYNTHROID, LEVOTHROID) 100 MCG tablet Take 125 mcg by mouth daily before breakfast.   . losartan-hydrochlorothiazide (HYZAAR) 100-25 MG tablet Take 1 tablet by mouth daily.  . metFORMIN (GLUCOPHAGE) 500 MG tablet Take 1 pill daily twice daily for diabetes  . montelukast (SINGULAIR) 10 MG tablet Take 10 mg by mouth at bedtime.  . pravastatin (PRAVACHOL) 10 MG tablet Take 10 mg by mouth daily.  Marland Kitchen SPIRIVA HANDIHALER 18 MCG inhalation capsule INHALE 1 CAPSULE  VIA HANDIHALER ONCE DAILY AT THE SAME TIME EVERY DAY  . venlafaxine (EFFEXOR) 75 MG tablet Take 75 mg by mouth 2 (two) times daily with a meal.    Allergies:   Sulfa antibiotics, Amoxicillin, Gabapentin, and Morphine and related  Review of Systems (ROS): Review of Systems  Constitutional: Negative for chills and fatigue.  HENT: Positive for dental problem. Negative for drooling and mouth sores.   Gastrointestinal: Negative for nausea and vomiting.     Vital Signs: Today's Vitals   11/15/19 0859 11/15/19 0900 11/15/19 0903  BP:   135/86  Pulse:   73  Resp:   16  Temp:   98.5 F (36.9 C)  TempSrc:   Oral  SpO2:   97%  Weight:  163 lb (73.9 kg)   Height:  5' 7.5" (1.715 m)    PainSc: 10-Worst pain ever      Physical Exam: Physical Exam Constitutional:      Appearance: Normal appearance.  HENT:     Mouth/Throat:     Mouth: Mucous membranes are moist.     Dentition: Abnormal dentition.     Comments: Poor dentition throughout.  Bottom gum is very tender to light palpation with a tongue depressor.  No drainage expressed with palpation.  Tenderness is localized from lower canine to canine. Cardiovascular:     Rate and Rhythm: Normal rate and regular rhythm.  Pulmonary:     Effort: Pulmonary effort is normal.     Breath sounds: Wheezing present.  Neurological:     Mental Status: She is alert.      Urgent Care Treatments / Results:   LABS: PLEASE NOTE: all labs that were ordered this encounter are listed, however only abnormal results are displayed. Labs Reviewed - No data to display  EKG: -None  RADIOLOGY: No results found.  PROCEDURES: Procedures  MEDICATIONS RECEIVED THIS VISIT: Medications - No data to display  PERTINENT CLINICAL COURSE NOTES/UPDATES:   Initial Impression / Assessment and Plan / Urgent Care Course:  Pertinent labs & imaging results that were available during my care of the patient were personally reviewed by me and considered in my medical decision making (see lab/imaging section of note for values and interpretations).  Debbie Hendricks is a 68 y.o. female who presents to [facility] today with complaints of dental pain, diagnosed with dental infection, and treated as such with the medications below. NP and patient reviewed discharge instructions below during visit.   Patient is well appearing overall in clinic today. She does not appear to be in any acute distress. Presenting symptoms (see HPI) and exam as documented above.   I have reviewed the follow up and strict return precautions for any new or worsening symptoms. Patient is aware of symptoms that would be deemed urgent/emergent, and would thus require further  evaluation either here or in the emergency department. At the time of discharge, she verbalized understanding and consent with the discharge plan as it was reviewed with her. All questions were fielded by provider and/or clinic staff prior to patient discharge.    Final Clinical Impressions / Urgent Care Diagnoses:   Final diagnoses:  Dental infection    New Prescriptions:  [place] Controlled Substance Registry consulted? Yes, I have consulted the [place] Controlled Substances Registry for this patient, and feel the risk/benefit ratio today is favorable for proceeding with this prescription for a controlled substance.  Meds ordered this encounter  Medications  . clindamycin (CLEOCIN) 300 MG capsule  Sig: Take 1 capsule (300 mg total) by mouth 4 (four) times daily for 7 days.    Dispense:  28 capsule    Refill:  0  . HYDROcodone-acetaminophen (NORCO/VICODIN) 5-325 MG tablet    Sig: Take 2 tablets by mouth every 4 (four) hours as needed.    Dispense:  6 tablet    Refill:  0  . diclofenac (VOLTAREN) 25 MG EC tablet    Sig: Take 1 tablet (25 mg total) by mouth 2 (two) times daily for 10 days.    Dispense:  20 tablet    Refill:  0      Discharge Instructions     You are being treated for dental infection.   Take the antibiotics as prescribed until they're finished. If you think you're having a reaction, stop the medication, take benadryl and go to the nearest urgent care/emergency room. Take a probiotic while taking the antibiotic to decrease the chances of stomach upset.   Take your anti-inflammatory medication as prescribed. You narcotic medication is only for breakthrough pain and will not would be refilled.  Follow-up with the dentist as soon as you can.    Recommended Follow up Care:  Patient encouraged to follow up with the following provider within the specified time frame, or sooner as dictated by the severity of her symptoms. As always, she was instructed that for any  urgent/emergent care needs, she should seek care either here or in the emergency department for more immediate evaluation.   Gertie Baron, DNP, NP-c    Gertie Baron, NP 11/15/19 [id number]

## 2020-04-21 ENCOUNTER — Ambulatory Visit
Admission: EM | Admit: 2020-04-21 | Discharge: 2020-04-21 | Disposition: A | Discharge status: Home / Self Care | Payer: Medicare Other | Attending: Family Medicine | Admitting: Family Medicine

## 2020-04-21 ENCOUNTER — Encounter: Payer: Self-pay | Admitting: Emergency Medicine

## 2020-04-21 ENCOUNTER — Other Ambulatory Visit: Payer: Self-pay

## 2020-04-21 DIAGNOSIS — N390 Urinary tract infection, site not specified: Secondary | ICD-10-CM | POA: Diagnosis not present

## 2020-04-21 HISTORY — DX: Type 2 diabetes mellitus without complications: E11.9

## 2020-04-21 LAB — URINALYSIS, COMPLETE (UACMP) WITH MICROSCOPIC
Bilirubin Urine: NEGATIVE
Glucose, UA: NEGATIVE mg/dL
Ketones, ur: NEGATIVE mg/dL
Nitrite: NEGATIVE
Protein, ur: NEGATIVE mg/dL
Specific Gravity, Urine: 1.015 (ref 1.005–1.030)
WBC, UA: >50 WBC/hpf (ref 0–5)
pH: 5.5 (ref 5.0–8.0)

## 2020-04-21 MED ORDER — NITROFURANTOIN MONOHYD MACRO 100 MG PO CAPS: 100.0000 mg | ORAL_CAPSULE | Freq: Two times a day (BID) | ORAL | 0 refills | Status: DC

## 2020-04-21 MED ORDER — PHENAZOPYRIDINE HCL 200 MG PO TABS: 200.0000 mg | ORAL_TABLET | Freq: Three times a day (TID) | ORAL | 0 refills | Status: DC | PRN

## 2020-04-21 NOTE — ED Triage Notes (Signed)
Patient in today c/o urinary frequency and dysuria since yesterday. Patient states she had a UTI ~6 weeks ago and treated with an antibiotic that she doesn't remember the name of.

## 2020-04-21 NOTE — Discharge Instructions (Addendum)
It was very nice seeing you today in clinic. Thank you for entrusting me with your care.  ° °As discussed, your urine is POSITIVE for infection. Will approach treatment as follows: ° °Prescription has been sent to your pharmacy for antibiotics.  °Please pick up and take as directed. FINISH the entire course of medication even if you are feeling better.  °A culture will be sent on your provided sample. If it comes back resistant to what I have prescribed you, someone will call you and let you know that we will need to change antibiotics. °Increase fluid intake as much as possible to flush your urinary tract.  °Water is always the best.  °Avoid caffeine until your infection clears up, as it can contribute to painful bladder spasms.  °May use Tylenol and/or Ibuprofen as needed for pain/fever. ° °Make arrangements to follow up with your regular doctor in 1 week for re-evaluation. If your symptoms/condition worsens, please seek follow up care either here or in the ER. Please remember, our [facility] providers are "right here with you" when you need us.  ° °Again, it was my pleasure to take care of you today. Thank you for choosing our clinic. I hope that you start to feel better quickly.  ° °Foday Cone, MSN, APRN, FNP-C, CEN °Advanced Practice Provider °[facility] ° °

## 2020-04-22 NOTE — ED Provider Notes (Signed)
[place], [place]   Name: Debbie Hendricks DOB: [birth year] MRN: [id number] CSN: [id number] PCP: Dola Argyle, MD  Arrival date and time:  04/21/20 1733  Chief Complaint:  Dysuria and Urinary Frequency  NOTE: Prior to seeing the patient today, I have reviewed the triage nursing documentation and vital signs. Clinical staff has updated patient's PMH/PSHx, current medication list, and drug allergies/intolerances to ensure comprehensive history available to assist in medical decision making.   History:   HPI: Debbie Hendricks is a 69 y.o. female who presents today with complaints of urinary symptoms that began with acute onset yesterday. She complains of dysuria, frequency, and urgency. She has not appreciated any gross hematuria, nor has she noticed her urine being malodorous. Patient denies any associated nausea, vomiting, fever, or chills. She has not experienced any pain in her lower back, flank area, or abdomen. Patient advises that she has a past medical history that is significant for recurrent urinary tract infections. She denies any vaginal pain, bleeding, or discharge.  Past Medical History:  Diagnosis Date   Anxiety    Cancer ([place])    Diabetes mellitus without complication ([place])    Femoral nerve injury    Hearing loss    right side   Hyperlipidemia    Hypertension    Thyroid disease     Past Surgical History:  Procedure Laterality Date   ABDOMINAL HYSTERECTOMY     ACOUSTIC NEUROMA RESECTION     BLADDER SURGERY     CARDIAC CATHETERIZATION     ECTOPIC PREGNANCY SURGERY      Family History  Problem Relation Age of Onset   Hypertension Mother    Thyroid disease Mother    Other Father 49       murdered    Social History   Tobacco Use   Smoking status: Current Some Day Smoker    Packs/day: 0.50    Types: Cigarettes   Smokeless tobacco: Never Used  Substance Use Topics   Alcohol use: No   Drug use: No    There are no problems to display for  this patient.   Home Medications:    Current Meds  Medication Sig   amLODipine (NORVASC) 5 MG tablet Take 5 mg by mouth daily.   aspirin 81 MG chewable tablet Chew by mouth daily.   atorvastatin (LIPITOR) 20 MG tablet Take 20 mg by mouth daily.   clonazePAM (KLONOPIN) 1 MG tablet Take 1 mg by mouth 3 (three) times daily as needed for anxiety.   fexofenadine (ALLEGRA) 180 MG tablet Take 1 tablet by mouth daily.   fluticasone (FLONASE) 50 MCG/ACT nasal spray Place into both nostrils daily.   levothyroxine (SYNTHROID, LEVOTHROID) 100 MCG tablet Take 125 mcg by mouth daily before breakfast.    losartan-hydrochlorothiazide (HYZAAR) 100-25 MG tablet Take 1 tablet by mouth daily.   meclizine (ANTIVERT) 25 MG tablet Take 1 tablet (25 mg total) by mouth 3 (three) times daily as needed for dizziness.   metFORMIN (GLUCOPHAGE) 500 MG tablet Take 1 pill daily twice daily for diabetes   montelukast (SINGULAIR) 10 MG tablet Take 10 mg by mouth at bedtime.   SPIRIVA HANDIHALER 18 MCG inhalation capsule INHALE 1 CAPSULE VIA HANDIHALER ONCE DAILY AT THE SAME TIME EVERY DAY   venlafaxine (EFFEXOR) 75 MG tablet Take 75 mg by mouth 2 (two) times daily with a meal.    Allergies:   Sulfa antibiotics, Amoxicillin, Gabapentin, and Morphine and related  Review of Systems (ROS):  Review  of systems NEGATIVE unless otherwise noted in narrative H&P section.   Vital Signs: Today's Vitals   04/21/20 1750 04/21/20 1751 04/21/20 1832  BP: 101/81    Pulse: 72    Resp: 18    Temp: 98.1 F (36.7 C)    TempSrc: Oral    SpO2: 99%    Weight:  149 lb (67.6 kg)   Height:  5' 7.5" (1.715 m)   PainSc: 10-Worst pain ever  10-Worst pain ever    Physical Exam: Physical Exam  Constitutional: She is oriented to person, place, and time and well-developed, well-nourished, and in no distress.  HENT:  Head: Normocephalic and atraumatic.  Eyes: Pupils are equal, round, and reactive to light.    Cardiovascular: Normal rate, regular rhythm, normal heart sounds and intact distal pulses.  Pulmonary/Chest: Effort normal and breath sounds normal.  Abdominal: Soft. Normal appearance and bowel sounds are normal. She exhibits no distension. There is abdominal tenderness in the suprapubic area. There is no CVA tenderness.  Neurological: She is alert and oriented to person, place, and time. Gait normal.  Skin: Skin is warm and dry. No rash noted. She is not diaphoretic.  Psychiatric: Memory, affect and judgment normal. Her mood appears anxious.  Nursing note and vitals reviewed.   Urgent Care Treatments / Results:   Orders Placed This Encounter  Procedures   Urine culture   Urinalysis, Complete w Microscopic    LABS: PLEASE NOTE: all labs that were ordered this encounter are listed, however only abnormal results are displayed. Labs Reviewed  URINALYSIS, COMPLETE (UACMP) WITH MICROSCOPIC - Abnormal; Notable for the following components:      Result Value   APPearance HAZY (*)    Hgb urine dipstick TRACE (*)    Leukocytes,Ua LARGE (*)    Bacteria, UA FEW (*)    All other components within normal limits  URINE CULTURE    EKG: -None  RADIOLOGY: No results found.  PROCEDURES: Procedures  MEDICATIONS RECEIVED THIS VISIT: Medications - No data to display  PERTINENT CLINICAL COURSE NOTES/UPDATES:   Initial Impression / Assessment and Plan / Urgent Care Course:  Pertinent labs & imaging results that were available during my care of the patient were personally reviewed by me and considered in my medical decision making (see lab/imaging section of note for values and interpretations).  Debbie Hendricks is a 69 y.o. female who presents to [facility] today with complaints of Dysuria and Urinary Frequency  Patient is well appearing overall in clinic today. She does not appear to be in any acute distress. Presenting symptoms (see HPI) and exam as documented above.   UA  was (+) for infection; reflex culture sent.  o Will treat with a 5 day course of nitrofurantoin. Patient encouraged to complete the entire course of antibiotics even if she begins to feel better. She was advised that if culture demonstrates resistance to the prescribed antibiotic, she will be contacted and advised of the need to change the antibiotic being used to treat her infection.   o Patient encouraged to increase her fluid intake as much as possible. Discussed that water is always best to flush the urinary tract. She was advised to avoid caffeine containing fluids until her infections clears, as caffeine can cause her to experience painful bladder spasms.   o May use Tylenol and/or Ibuprofen as needed for pain/fever. Will also send in a Rx for phenazopyridine to help relieve her current urinary pain per patient request.  Discussed follow up with primary care physician in 1 week for re-evaluation. I have reviewed the follow up and strict return precautions for any new or worsening symptoms. Patient is aware of symptoms that would be deemed urgent/emergent, and would thus require further evaluation either here or in the emergency department. At the time of discharge, she verbalized understanding and consent with the discharge plan as it was reviewed with her. All questions were fielded by provider and/or clinic staff prior to patient discharge.    Final Clinical Impressions / Urgent Care Diagnoses:   Final diagnoses:  Urinary tract infection without hematuria, site unspecified    New Prescriptions:  [place] Controlled Substance Registry consulted? Not Applicable  Meds ordered this encounter  Medications   nitrofurantoin, macrocrystal-monohydrate, (MACROBID) 100 MG capsule    Sig: Take 1 capsule (100 mg total) by mouth 2 (two) times daily.    Dispense:  10 capsule    Refill:  0   phenazopyridine (PYRIDIUM) 200 MG tablet    Sig: Take 1 tablet (200 mg total) by mouth 3 (three) times daily as  needed for pain.    Dispense:  9 tablet    Refill:  0    Recommended Follow up Care:  Patient encouraged to follow up with the following provider within the specified time frame, or sooner as dictated by the severity of her symptoms. As always, she was instructed that for any urgent/emergent care needs, she should seek care either here or in the emergency department for more immediate evaluation.  Follow-up Information    Dola Argyle, MD In 1 week.   Specialty: Family Medicine Why: General reassessment of symptoms if not improving Contact information: [place] [address] [phone]         NOTE: This note was prepared using Dragon dictation software along with smaller phrase technology. Despite my best ability to proofread, there is the potential that transcriptional errors may still occur from this process, and are completely unintentional.    Karen Kitchens, NP 04/22/20 1858

## 2020-04-24 LAB — URINE CULTURE: Culture: >=100000 — AB

## 2020-04-29 ENCOUNTER — Encounter: Payer: Self-pay | Admitting: Emergency Medicine

## 2020-04-29 ENCOUNTER — Ambulatory Visit
Admission: EM | Admit: 2020-04-29 | Discharge: 2020-04-29 | Disposition: A | Discharge status: Home / Self Care | Payer: Medicare Other | Attending: Family Medicine | Admitting: Family Medicine

## 2020-04-29 ENCOUNTER — Other Ambulatory Visit: Payer: Self-pay

## 2020-04-29 DIAGNOSIS — N390 Urinary tract infection, site not specified: Secondary | ICD-10-CM | POA: Diagnosis present

## 2020-04-29 DIAGNOSIS — R110 Nausea: Secondary | ICD-10-CM | POA: Insufficient documentation

## 2020-04-29 LAB — URINALYSIS, COMPLETE (UACMP) WITH MICROSCOPIC
Bilirubin Urine: NEGATIVE
Glucose, UA: NEGATIVE mg/dL
Ketones, ur: NEGATIVE mg/dL
Nitrite: NEGATIVE
Protein, ur: NEGATIVE mg/dL
Specific Gravity, Urine: 1.01 (ref 1.005–1.030)
pH: 5.5 (ref 5.0–8.0)

## 2020-04-29 MED ORDER — CIPROFLOXACIN HCL 250 MG PO TABS: 250.0000 mg | ORAL_TABLET | Freq: Two times a day (BID) | ORAL | 0 refills | Status: DC

## 2020-04-29 MED ORDER — ONDANSETRON 8 MG PO TBDP: 8.0000 mg | ORAL_TABLET | Freq: Three times a day (TID) | ORAL | 0 refills | Status: DC | PRN

## 2020-04-29 NOTE — ED Triage Notes (Signed)
Pt c/o dysuria. She was seen on 04/21/20 for a UTI. She states she did not get better. She was given nitrofurantoin and pyridium. She states her temperature has been running higher than her normal. She has also had nausea and fatigue.

## 2020-04-29 NOTE — Discharge Instructions (Addendum)
Increase water intake

## 2020-04-29 NOTE — ED Provider Notes (Signed)
[facility]    CSN: [id number] Arrival date & time: 04/29/20  1527      History   Chief Complaint Chief Complaint  Patient presents with  . Dysuria    HPI Debbie Hendricks is a 69 y.o. female.   69 yo female with c/o burning with urination, nausea and fatigue. Patient was seen on 04/21/20 and treated with macrodantin (culture sensitive). States she did not feel like the antibiotic cleared the infection as she continued with symptoms. Denies any vomiting, abdominal pain, flank pain.    Dysuria   Past Medical History:  Diagnosis Date  . Anxiety   . Cancer ([place])   . Diabetes mellitus without complication ([place])   . Femoral nerve injury   . Hearing loss    right side  . Hyperlipidemia   . Hypertension   . Thyroid disease     There are no problems to display for this patient.   Past Surgical History:  Procedure Laterality Date  . ABDOMINAL HYSTERECTOMY    . ACOUSTIC NEUROMA RESECTION    . BLADDER SURGERY    . CARDIAC CATHETERIZATION    . ECTOPIC PREGNANCY SURGERY      OB History   No obstetric history on file.      Home Medications    Prior to Admission medications   Medication Sig Start Date End Date Taking? Authorizing Provider  amLODipine (NORVASC) 5 MG tablet Take 5 mg by mouth daily.   Yes [provider]  aspirin 81 MG chewable tablet Chew by mouth daily.   Yes [provider]  atorvastatin (LIPITOR) 20 MG tablet Take 20 mg by mouth daily.   Yes [provider]  clonazePAM (KLONOPIN) 1 MG tablet Take 1 mg by mouth 3 (three) times daily as needed for anxiety.   Yes [provider]  fexofenadine (ALLEGRA) 180 MG tablet Take 1 tablet by mouth daily. 03/11/20 03/11/21 Yes [provider]  fluticasone (FLONASE) 50 MCG/ACT nasal spray Place into both nostrils daily.   Yes [provider]  levothyroxine (SYNTHROID, LEVOTHROID) 100 MCG tablet Take 125 mcg by mouth daily before breakfast.    Yes  [provider]  losartan-hydrochlorothiazide (HYZAAR) 100-25 MG tablet Take 1 tablet by mouth daily.   Yes [provider]  metFORMIN (GLUCOPHAGE) 500 MG tablet Take 1 pill daily twice daily for diabetes 05/21/19 05/20/20 Yes [provider]  montelukast (SINGULAIR) 10 MG tablet Take 10 mg by mouth at bedtime.   Yes [provider]  SPIRIVA HANDIHALER 18 MCG inhalation capsule INHALE 1 CAPSULE VIA HANDIHALER ONCE DAILY AT THE SAME TIME EVERY DAY 05/29/18  Yes [provider]  venlafaxine (EFFEXOR) 75 MG tablet Take 75 mg by mouth 2 (two) times daily with a meal.   Yes [provider]  ciprofloxacin (CIPRO) 250 MG tablet Take 1 tablet (250 mg total) by mouth every 12 (twelve) hours. 04/29/20   Norval Gable, MD  meclizine (ANTIVERT) 25 MG tablet Take 1 tablet (25 mg total) by mouth 3 (three) times daily as needed for dizziness. 05/14/17   Harvest Dark, MD  nitrofurantoin, macrocrystal-monohydrate, (MACROBID) 100 MG capsule Take 1 capsule (100 mg total) by mouth 2 (two) times daily. 04/21/20   Karen Kitchens, NP  ondansetron (ZOFRAN ODT) 8 MG disintegrating tablet Take 1 tablet (8 mg total) by mouth every 8 (eight) hours as needed. 04/29/20   Norval Gable, MD  phenazopyridine (PYRIDIUM) 200 MG tablet Take 1 tablet (200 mg  total) by mouth 3 (three) times daily as needed for pain. 04/21/20   Karen Kitchens, NP  omeprazole (PRILOSEC) 40 MG capsule Take 40 mg by mouth daily.  10/20/19  [provider]  pravastatin (PRAVACHOL) 10 MG tablet Take 10 mg by mouth daily.  04/21/20  [provider]    Family History Family History  Problem Relation Age of Onset  . Hypertension Mother   . Thyroid disease Mother   . Other Father 37       murdered    Social History Social History   Tobacco Use  . Smoking status: Current Some Day Smoker    Packs/day: 0.50    Types: Cigarettes  . Smokeless tobacco: Never Used  Vaping Use  . Vaping Use:  Never used  Substance Use Topics  . Alcohol use: No  . Drug use: No     Allergies   Sulfa antibiotics, Amoxicillin, Gabapentin, and Morphine and related   Review of Systems Review of Systems  Genitourinary: Positive for dysuria.     Physical Exam Triage Vital Signs ED Triage Vitals  Enc Vitals Group     BP 04/29/20 1554 111/82     Pulse Rate 04/29/20 1554 66     Resp 04/29/20 1554 18     Temp 04/29/20 1554 98.7 F (37.1 C)     Temp Source 04/29/20 1554 Oral     SpO2 04/29/20 1554 96 %     Weight 04/29/20 1551 149 lb 0.5 oz (67.6 kg)     Height 04/29/20 1551 5' 7.5" (1.715 m)     Head Circumference --      Peak Flow --      Pain Score 04/29/20 1549 9     Pain Loc --      Pain Edu? --      Excl. in [place]? --    No data found.  Updated Vital Signs BP 111/82 (BP Location: Left Arm)   Pulse 66   Temp 98.7 F (37.1 C) (Oral)   Resp 18   Ht 5' 7.5" (1.715 m)   Wt 67.6 kg   SpO2 96%   BMI 23.00 kg/m   Visual Acuity Right Eye Distance:   Left Eye Distance:   Bilateral Distance:    Right Eye Near:   Left Eye Near:    Bilateral Near:     Physical Exam Vitals and nursing note reviewed.  Constitutional:      General: She is not in acute distress.    Appearance: She is not toxic-appearing or diaphoretic.  Abdominal:     General: Bowel sounds are normal. There is no distension.     Palpations: Abdomen is soft.     Tenderness: There is no abdominal tenderness. There is no right CVA tenderness, left CVA tenderness or guarding.  Neurological:     Mental Status: She is alert.      UC Treatments / Results  Labs (all labs ordered are listed, but only abnormal results are displayed) Labs Reviewed  URINALYSIS, COMPLETE (UACMP) WITH MICROSCOPIC - Abnormal; Notable for the following components:      Result Value   APPearance HAZY (*)    Hgb urine dipstick TRACE (*)    Leukocytes,Ua TRACE (*)    Bacteria, UA FEW (*)    All other components within normal limits      EKG   Radiology No results found.  Procedures Procedures (including critical care time)  Medications Ordered in UC Medications -  No data to display  Initial Impression / Assessment and Plan / UC Course  I have reviewed the triage vital signs and the nursing notes.  Pertinent labs & imaging results that were available during my care of the patient were reviewed by me and considered in my medical decision making (see chart for details).      Final Clinical Impressions(s) / UC Diagnoses   Final diagnoses:  Lower urinary tract infectious disease  Nausea without vomiting     Discharge Instructions     Increase water intake    ED Prescriptions    Medication Sig Dispense Auth. Provider   ciprofloxacin (CIPRO) 250 MG tablet Take 1 tablet (250 mg total) by mouth every 12 (twelve) hours. 10 tablet Norval Gable, MD   ondansetron (ZOFRAN ODT) 8 MG disintegrating tablet Take 1 tablet (8 mg total) by mouth every 8 (eight) hours as needed. 6 tablet Norval Gable, MD      1. Lab results and diagnosis reviewed with patient 2. rx as per orders above; reviewed possible side effects, interactions, risks and benefits (antibiotic dose adjusted for renal function) 3. Recommend supportive treatment as above 4. Check urine culture 5. Follow-up prn if symptoms worsen or don't improve   PDMP not reviewed this encounter.   Norval Gable, MD 04/29/20 1739

## 2020-08-01 ENCOUNTER — Ambulatory Visit (INDEPENDENT_AMBULATORY_CARE_PROVIDER_SITE_OTHER): Payer: Medicare Other | Admitting: Internal Medicine

## 2020-08-01 VITALS — BP 130/83 | HR 77 | Ht 68.0 in | Wt 155.0 lb

## 2020-08-01 DIAGNOSIS — I10 Essential (primary) hypertension: Secondary | ICD-10-CM

## 2020-08-01 DIAGNOSIS — G4719 Other hypersomnia: Secondary | ICD-10-CM

## 2020-08-01 DIAGNOSIS — K219 Gastro-esophageal reflux disease without esophagitis: Secondary | ICD-10-CM | POA: Diagnosis not present

## 2020-08-01 DIAGNOSIS — G4709 Other insomnia: Secondary | ICD-10-CM | POA: Diagnosis not present

## 2020-08-01 DIAGNOSIS — G4734 Idiopathic sleep related nonobstructive alveolar hypoventilation: Secondary | ICD-10-CM

## 2020-08-01 NOTE — Patient Instructions (Signed)
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
## 2020-08-03 DIAGNOSIS — G4734 Idiopathic sleep related nonobstructive alveolar hypoventilation: Secondary | ICD-10-CM | POA: Insufficient documentation

## 2020-08-03 DIAGNOSIS — G4709 Other insomnia: Secondary | ICD-10-CM | POA: Insufficient documentation

## 2020-08-03 DIAGNOSIS — G4719 Other hypersomnia: Secondary | ICD-10-CM | POA: Insufficient documentation

## 2020-08-18 ENCOUNTER — Encounter (INDEPENDENT_AMBULATORY_CARE_PROVIDER_SITE_OTHER): Payer: Medicare Other | Admitting: Internal Medicine

## 2020-08-18 DIAGNOSIS — G4733 Obstructive sleep apnea (adult) (pediatric): Secondary | ICD-10-CM | POA: Diagnosis not present

## 2020-08-22 NOTE — Procedures (Signed)
[place] Report Part I                                                               Phone: [phone] Fax: [phone]  Patient Name: Debbie Hendricks, Debbie Hendricks. Acquisition Number: [id number]  Date of Birth: Mar 30, 1951 Acquisition Date: 08/18/2020  Referring Physician: Lyn Henri, PA-C     History: The patient is a 69 year old female who was referred for evaluation of possible sleep apnea. 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  Medications: Voltaren, Ventolin, Jardiance, Atrovent, Commit, Mysoline, Phenergan, Norvasc,Lipitor, Tussionex, Cipro, Klonopin, Flexeril, Allegra, Flonase, Vistaril, Synthroid, Hyzaar, Antivert, Glucophage, Singulair, Macrobid.Zofran, Protonix, Pyridium, Inderal, Seroquel, Carafate, Effexor, Spiriva.  Procedure: This routine overnight polysomnogram was performed on the [facility] 5 using the standard diagnostic protocol. This included 6 channels of EEG, 2 channels of EOG, chin EMG, bilateral anterior tibialis EMG, nasal/oral thermistor, PTAF (nasal pressure transducer), chest and abdominal wall movements, EKG, and pulse oximetry.  Description: The total recording time was 424.5 minutes. The total sleep time was 346.5 minutes. There were a total of 54.8 minutes of wakefulness after sleep onset for a?reduced?sleep efficiency of 81.6%. The latency to sleep onset was within normal limits?? at 23.2 minutes. The R sleep onset latency was within normal limits at 72.5 minutes.?? Sleep parameters, as a percentage of the total sleep time, demonstrated 3.6% of sleep was in N1 sleep, 59.6% N2, 6.5% N3 and 30.3% R sleep. There were a total of 45 arousals for an arousal index of 7.8 arousals per hour of sleep that was normal.???  Respiratory monitoring demonstrated occasional mild degree of snoring in all positions. There  were 42 apneas and hypopneas for an Apnea Hypopnea Index of 7.3 apneas and hypopneas per hour of sleep. The REM related apnea hypopnea index was 20.6/hr of REM sleep compared to a NREM AHI of 1.5/hr.  The average duration of the respiratory events was 41.2 seconds with a maximum duration of 98.0 seconds. The respiratory events occurred mostly in the supine position with an AHI of 12.6. The respiratory events were associated with peripheral oxygen desaturations on the average to 84%. The lowest oxygen desaturation associated with a respiratory event was 73%. Additionally, the baseline oxygen saturation during wakefulness was 93%, during NREM sleep averaged 91%, and during REM sleep averaged  90%. The total duration of oxygen < 90% was 76.9 minutes and <80% was 0.3 minutes.  Cardiac monitoring- did not demonstrate transient cardiac decelerations associated with the apneas. There were no significant cardiac rhythm irregularities.   Periodic limb movement monitoring- demonstrated that there were 62 periodic limb movements for a periodic limb movement index of 10.7 periodic limb movements per hour of sleep.   Impression: ???This routine overnight polysomnogram demonstrated significant, REM-related obstructive sleep apnea with an overall Apnea Hypopnea Index of 7.3 apneas and hypopneas per hour of sleep which increased to 20.6 in REM with the most severe oxygen desaturations occurring in supine, REM. ?   There was a slightly elevated periodic limb movement index of 10.7 periodic limb movements per hour of sleep. Sometimes these limb movements subside on CPAP. Clinical correlation is suggested.  There was a ?  reduced sleep efficiency with a reduced percentage of N3 sleep. These findings would appear to be due to the combination of obstructive sleep apnea and psychophysiological factors such as anxiety and depression.??????????????  ?Recommendations:    1. CPAP titration study is indicated in this case due to  MILD obstructive sleep apnea. The overall AHI was noted to be 7.3 per hour. 2. Nasal Decongestants and antihistamines may be of help for increased upper airway resistance. 3. Clinical correlation is recommended. Please feel free to call the office for any further questions or assistance in the care of this patient.    Allyne Gee, MD [facility] Diplomate ABMS Pulmonary Critical Care Medicine Sleep Medicine Electronically reviewed and digitally signed      [place] Report Part II  Phone: [phone] Fax: [phone]  Patient last name Debbie Hendricks Neck Size 14.0 in. Acquisition [id number]  Patient first name Debbie Hendricks. Weight 155.0 lbs. Started 08/18/2020 at 11:02:41 PM  Birth date [birth year] Height 68.0 in. Stopped 08/19/2020 at 6:16:11 AM  Age 40 BMI 23.6 lb/in2 Duration 424.5  Study Type Adult      Report generated by: Herschel Senegal. Wadie Lessen, RPSGT Sleep Data: Lights Out: 11:10:29 PM Sleep Onset: 11:33:41 PM  Lights On: 6:14:59 AM Sleep Efficiency: 81.6 %  Total Recording Time: 424.5 min Sleep Latency (from Lights Off) 23.2 min  Total Sleep Time (TST): 346.5 min R Latency (from Sleep Onset): 72.5 min  Sleep Period Time: 366.0 min Total number of awakenings: 15  Wake during sleep: 19.5 min Wake After Sleep Onset (WASO): 54.8 min   Sleep Data:         Arousal Summary: Stage  Latency from lights out (min) Latency from sleep onset (min) Duration (min) % Total Sleep Time  Normal values  N 1 92.7 69.5 12.5 3.6 (5%)  N 2 23.2 0.0 206.5 59.6 (50%)  N 3 51.2 28.0 22.5 6.5 (20%)  R 95.7 72.5 105.0 30.3 (25%)    Number Index  Spontaneous 40 6.9  Apneas & Hypopneas 9 1.6  RERAs 0 0.0       (Apneas & Hypopneas & RERAs)  (9) (1.6)  Limb Movement 0 0.0  Snore 0 0.0  TOTAL 49 8.5      Respiratory Data:  CA OA MA Apnea Hypopnea* A+ H RERA Total  Number 0 6 0 6 36 42 0 42  Mean Dur (sec) 0.0 18.1 0.0 18.1 45.1 41.2 0.0 41.2  Max Dur (sec) 0.0 27.5 0.0 27.5  98.0 98.0 0.0 98.0  Total Dur (min) 0.0 1.8 0.0 1.8 27.0 28.8 0.0 28.8  % of TST 0.0 0.5 0.0 0.5 7.8 8.3 0.0 8.3  Index (#/h TST) 0.0 1.0 0.0 1.0 6.2 7.3 0.0 7.3  *Hypopneas scored based on 4% or greater desaturation.  Sleep Stage:        REM NREM TST  AHI 20.6 1.5 7.3  RDI 20.6 1.5 7.3            Body Position Data:  Sleep (min) TST (%) REM (min) NREM (min) CA (#) OA (#) MA (#) HYP (#) AHI (#/h) RERA (#) RDI (#/h) Desat (#)  Supine 104.9 30.27 47.9 57.0 0 6 0 16 12.6 0 12.6 27  Non-Supine 241.60 69.73 57.10 184.50 0.00 0.00 0.00 20.00 4.97 0 4.97 35.00  Left: 236.5 68.25 52.0 184.5 0 0 0 18 4.6 0 4.6 32  Right: 5.1 1.47 5.1 0.0 0 0 0 2 23.5 0 23.5 3  Snoring: Total number of snoring episodes  0  Total time with snoring    min (   % of sleep)   Oximetry Distribution:             WK REM NREM TOTAL  Average (%)   93 90 91 91  < 90% 6.6 32.1 38.2 76.9  < 80% 0.0 0.3 0.0 0.3  < 70% 0.0 0.0 0.0 0.0  # of Desaturations* 2 47 12 61  Desat Index (#/hour) 1.7 27.1 3.2 11.2  Desat Max (%) 8 18 6 18   Desat Max Dur (sec) 28.0 96.0 113.0 113.0  Approx Min O2 during sleep 73  Approx min O2 during a respiratory event 73  Was Oxygen added (Y/N) and final rate No:   0 LPM  *Desaturations based on 3% or greater drop from baseline.   Cheyne Stokes Breathing: None Present     Heart Rate Summary:  Average Heart Rate During Sleep 64.3 bpm      Highest Heart Rate During Sleep (95th %) 68.0 bpm      Highest Heart Rate During Sleep 90 bpm      Highest Heart Rate During Recording (TIB) 206 bpm (artifact)   Heart Rate Observations: Event Type # Events   Bradycardia 0 Lowest HR Scored: N/A  Sinus Tachycardia During Sleep 0 Highest HR Scored: N/A  Narrow Complex Tachycardia 0 Highest HR Scored: N/A  Wide Complex Tachycardia 0 Highest HR Scored: N/A  Asystole 0 Longest Pause: N/A  Atrial Fibrillation 0 Duration Longest Event: N/A  Other Arrythmias  No Type:     Periodic Limb Movement Data: (Primary legs unless otherwise noted) Total # Limb Movement 62 Limb Movement Index 10.7  Total # PLMS 62 PLMS Index 10.7  Total # PLMS Arousals    PLMS Arousal Index     Percentage Sleep Time with PLMS 24.61min (6.9 % sleep)  Mean Duration limb movements (secs) 240.4

## 2020-08-29 ENCOUNTER — Ambulatory Visit (INDEPENDENT_AMBULATORY_CARE_PROVIDER_SITE_OTHER): Payer: Medicare Other | Admitting: Internal Medicine

## 2020-08-29 VITALS — Wt 155.0 lb

## 2020-08-29 DIAGNOSIS — G4733 Obstructive sleep apnea (adult) (pediatric): Secondary | ICD-10-CM

## 2020-08-29 DIAGNOSIS — J449 Chronic obstructive pulmonary disease, unspecified: Secondary | ICD-10-CM

## 2020-08-29 DIAGNOSIS — Z7189 Other specified counseling: Secondary | ICD-10-CM

## 2020-08-29 NOTE — Progress Notes (Signed)
Sleep Medicine   Office Visit  Patient Name: Debbie Hendricks DOB: 02-18-1951 MRN [id number]   I connected with the patient at 11:32 a.m. by telephone and verified the patients identity using two identifiers.   I discussed the limitations, risks, security and privacy concerns of performing an evaluation and management service by telephone and the availability of in person appointments. I also discussed with the patient that there may be a patient responsible charge related to the service.  The patient expressed understanding and agrees to proceed.   Chief Complaint: study results/sleep apnea   HISTORY OF PRESENT ILLNESS: Debbie Hendricks is seen today for follow up to discuss the results of her recent sleep study demonstrating mild, REM related apnea. She has reported recent nightmares and she has daily morning headaches.  ROS  General: (-) fever, (-) chills, (-) night sweat Nose and Sinuses: (-) nasal stuffiness or itchiness, (-) postnasal drip, (-) nosebleeds, (-) sinus trouble. Mouth and Throat: (-) sore throat, (-) hoarseness. Neck: (-) swollen glands, (-) enlarged thyroid, (-) neck pain. Respiratory: - cough, - shortness of breath, - wheezing. Neurologic: - numbness, - tingling. Psychiatric: - anxiety, - depression Sleep behavior: -sleep paralysis -hypnogogic hallucinations -dream enactment      +vivid dreams -cataplexy -night terrors -sleep walking   Current Medication: Outpatient Encounter Medications as of 08/29/2020  Medication Sig Note  . albuterol (VENTOLIN HFA) 108 (90 Base) MCG/ACT inhaler Inhale into the lungs.   Marland Kitchen amLODipine (NORVASC) 5 MG tablet Take 5 mg by mouth daily.   Marland Kitchen aspirin 81 MG chewable tablet Chew by mouth daily.   Marland Kitchen atorvastatin (LIPITOR) 20 MG tablet Take 20 mg by mouth daily.   . chlorpheniramine-HYDROcodone (TUSSIONEX) 10-8 MG/5ML SUER SMARTSIG:5 Milliliter(s) By Mouth Every 12 Hours PRN   . ciprofloxacin (CIPRO) 250 MG tablet Take 1 tablet (250 mg total) by  mouth every 12 (twelve) hours.   . clonazePAM (KLONOPIN) 1 MG tablet Take 1 mg by mouth 3 (three) times daily as needed for anxiety.   . cyclobenzaprine (FLEXERIL) 5 MG tablet Take by mouth.   . diclofenac Sodium (VOLTAREN) 1 % GEL Voltaren 1 % topical gel   . empagliflozin (JARDIANCE) 10 MG TABS tablet Take by mouth.   . fexofenadine (ALLEGRA) 180 MG tablet Take 1 tablet by mouth daily.   . fluticasone (FLONASE) 50 MCG/ACT nasal spray Place into both nostrils daily. 02/05/2019: prn  . hydrOXYzine (VISTARIL) 50 MG capsule TAKE 1 3 CAPSULES BY MOUTH AT BEDTIME AS NEEDED FOR SLEEP   . ipratropium (ATROVENT) 0.03 % nasal spray 2 sprays by Each Nare route Three (3) times a day.   . levothyroxine (SYNTHROID, LEVOTHROID) 100 MCG tablet Take 125 mcg by mouth daily before breakfast.    . losartan-hydrochlorothiazide (HYZAAR) 100-25 MG tablet Take 1 tablet by mouth daily.   . meclizine (ANTIVERT) 25 MG tablet Take 1 tablet (25 mg total) by mouth 3 (three) times daily as needed for dizziness. 10/20/2019: PRN  . metFORMIN (GLUCOPHAGE) 500 MG tablet Take 1 pill daily twice daily for diabetes   . montelukast (SINGULAIR) 10 MG tablet Take 10 mg by mouth at bedtime.   . nicotine polacrilex (COMMIT) 2 MG lozenge Place inside cheek.   . nitrofurantoin, macrocrystal-monohydrate, (MACROBID) 100 MG capsule Take 1 capsule (100 mg total) by mouth 2 (two) times daily.   . ondansetron (ZOFRAN ODT) 8 MG disintegrating tablet Take 1 tablet (8 mg total) by mouth every 8 (eight) hours as needed.   . pantoprazole (  PROTONIX) 40 MG tablet Take 40 mg by mouth 2 (two) times daily.   . phenazopyridine (PYRIDIUM) 200 MG tablet Take 1 tablet (200 mg total) by mouth 3 (three) times daily as needed for pain.   . primidone (MYSOLINE) 50 MG tablet Take by mouth.   . promethazine (PHENERGAN) 12.5 MG tablet Take by mouth.   . propranolol (INDERAL) 40 MG tablet Take 40 mg by mouth 2 (two) times daily.   . QUEtiapine (SEROQUEL) 25 MG  tablet Take 12.5-25 mg by mouth at bedtime.   Marland Kitchen SPIRIVA HANDIHALER 18 MCG inhalation capsule INHALE 1 CAPSULE VIA HANDIHALER ONCE DAILY AT THE SAME TIME EVERY DAY   . sucralfate (CARAFATE) 1 g tablet Take 1 g by mouth 4 (four) times daily.   Marland Kitchen venlafaxine (EFFEXOR) 75 MG tablet Take 75 mg by mouth 2 (two) times daily with a meal.   . [DISCONTINUED] omeprazole (PRILOSEC) 40 MG capsule Take 40 mg by mouth daily.   . [DISCONTINUED] pravastatin (PRAVACHOL) 10 MG tablet Take 10 mg by mouth daily.    No facility-administered encounter medications on file as of 08/29/2020.    Surgical History: Past Surgical History:  Procedure Laterality Date  . ABDOMINAL HYSTERECTOMY    . ACOUSTIC NEUROMA RESECTION    . BLADDER SURGERY    . CARDIAC CATHETERIZATION    . ECTOPIC PREGNANCY SURGERY      Medical History: Past Medical History:  Diagnosis Date  . Anxiety   . Cancer ([place])   . Diabetes mellitus without complication ([place])   . Femoral nerve injury   . Hearing loss    right side  . Hyperlipidemia   . Hypertension   . OSA (obstructive sleep apnea)   . Thyroid disease     Family History: Non contributory to the present illness  Social History: Social History   Socioeconomic History  . Marital status: Single    Spouse name: Not on file  . Number of children: Not on file  . Years of education: Not on file  . Highest education level: Not on file  Occupational History  . Not on file  Tobacco Use  . Smoking status: Current Some Day Smoker    Packs/day: 0.50    Types: Cigarettes  . Smokeless tobacco: Never Used  Vaping Use  . Vaping Use: Never used  Substance and Sexual Activity  . Alcohol use: No  . Drug use: No  . Sexual activity: Not on file  Other Topics Concern  . Not on file  Social History Narrative  . Not on file   Social Determinants of Health   Financial Resource Strain:   . Difficulty of Paying Living Expenses: Not on file  Food Insecurity:   . Worried About  Charity fundraiser in the Last Year: Not on file  . Ran Out of Food in the Last Year: Not on file  Transportation Needs:   . Lack of Transportation (Medical): Not on file  . Lack of Transportation (Non-Medical): Not on file  Physical Activity:   . Days of Exercise per Week: Not on file  . Minutes of Exercise per Session: Not on file  Stress:   . Feeling of Stress : Not on file  Social Connections:   . Frequency of Communication with Friends and Family: Not on file  . Frequency of Social Gatherings with Friends and Family: Not on file  . Attends Religious Services: Not on file  . Active Member of Clubs or Organizations: Not  on file  . Attends Archivist Meetings: Not on file  . Marital Status: Not on file  Intimate Partner Violence:   . Fear of Current or Ex-Partner: Not on file  . Emotionally Abused: Not on file  . Physically Abused: Not on file  . Sexually Abused: Not on file    Vital Signs: Weight 155 lb (70.3 kg).  Examination: General Appearance: The patient is well-developed, well-nourished, and in no distress. Neck Circumference:   Skin: Gross inspection of skin unremarkable. Head: normocephalic, no gross deformities. Eyes: no gross deformities noted. ENT: ears appear grossly normal Neurologic: Alert and oriented. No involuntary movements.        SLEEP STUDIES:  1. PSG 08/18/20 AHI 7 (21 in REM) SpO54min 73%   LABS: No results found for this or any previous visit (from the past 2160 hour(s)).  Radiology: No results found.   Assessment and Plan: Patient Active Problem List   Diagnosis Date Noted  . OSA (obstructive sleep apnea)   . Other insomnia 08/03/2020  . Nocturnal hypoxia 08/03/2020  . Other hypersomnia 08/03/2020  . GERD (gastroesophageal reflux disease) 08/01/2020  . HTN (hypertension) 08/01/2020     The study results, diagnosis and treatment  Recommendations were reviewed with the patient. She has mild but significant sleep  apnea with desaturations down to 73%. A CPAP titration study is recommended. She is reporting nightmares and morning headaches. Hopefully this will improve with the use of CPAP.    1. OSA- Given her history of COPD a lab titration study will be ordered. Follow up 30+ days after set up. 2. CPAP couseling-Discussed importance of adequate CPAP use as well as proper care and cleaning techniques of machine and all supplies.  Prolonged period of consultation spent discussing the importance of compliance. 3. COPD medical management inhalers as necessary. 4. GERD has been treated we will continue supportive care  General Counseling: I have discussed the findings of the evaluation and examination with Debbie Hendricks.  I have also discussed any further diagnostic evaluation thatmay be needed or ordered today. Debbie Hendricks verbalizes understanding of the findings of todays visit. We also reviewed her medications today and discussed drug interactions and side effects including but not limited excessive drowsiness and altered mental states. We also discussed that there is always a risk not just to her but also people around her. she has been encouraged to call the office with any questions or concerns that should arise related to todays visit.   I have personally obtained a history, evaluated the patient, evaluated pertinent data, formulated the assessment and plan and placed orders.  This patient was seen by Debbie Hendricks AGNP-C in Collaboration with Dr. Devona Konig as a part of collaborative care agreement.  Richelle Ito Saunders Glance, PhD, FAASM  Diplomate, American Board of Sleep Medicine    Allyne Gee, MD [facility] Diplomate ABMS Pulmonary and Critical Care Medicine Sleep medicine

## 2020-08-29 NOTE — Patient Instructions (Signed)

## 2020-09-08 ENCOUNTER — Encounter (INDEPENDENT_AMBULATORY_CARE_PROVIDER_SITE_OTHER): Payer: Medicare Other | Admitting: Internal Medicine

## 2020-09-08 DIAGNOSIS — G4733 Obstructive sleep apnea (adult) (pediatric): Secondary | ICD-10-CM

## 2020-09-09 NOTE — Procedures (Signed)
[facility] Report Part I  Phone: [phone] Fax: [phone]  Patient Name: Debbie Hendricks, Debbie Hendricks Acquisition Number: [id number]  Date of Birth: July 30, 1951 Acquisition Date: 09/08/2020  Referring Physician: Lyn Henri, PA-C     History: The patient is a 69 year old female with obstructive sleep apnea for CPAP titration. Medical History: OSA, PTSD, COPD, GERD, cancer, diabetes mellitus, femoral nerve injury, hyperlipidemia, hypertension, thyroid disease, right sided hearing loss.  Medications: Voltaren, Ventolin, Jardiance, Atrovent, Commit, Mysoline, Penergan, Norvasc, lipitor, Tussinex, Cipro, Klonipin, flexeril, Allegra, Flonase, Vistaril, Synthroid, Hyzaar, Antivert, Glucophage, Singulair, macrobid, Zofran, Protonix, Pyridium, Inderal, Seroquel, Carafate, Effexor, Spiriva.  Procedure: This routine overnight polysomnogram was performed on the [facility] 5 using the standard CPAP protocol. This included 6 channels of EEG, 2 channels of EOG, chin EMG, bilateral anterior tibialis EMG, nasal/oral thermistor, PTAF (nasal pressure transducer), chest and abdominal wall movements, EKG, and pulse oximetry.  Description: The total recording time was 407.4 minutes. The total sleep time was 382.7 minutes. There were a total of 5.5 minutes of wakefulness after sleep onset for a good?sleep efficiency of 93.9%. The latency to sleep onset was within normal limits?at 19.2 minutes. The R sleep onset latency was?prolonged at 170.0 minutes. Sleep parameters, as a percentage of the total sleep time, demonstrated 4.8% of sleep was in N1 sleep, 69.4% N2, 0.0% N3 and 25.8% R sleep. There were a total of 26 arousals for an arousal index of 4.1 arousals per hour of sleep that was normal.???  Overall, there were a total of 3 respiratory events for a respiratory disturbance index, which includes apneas, hypopneas and RERAs (increased respiratory effort) of 0.5 respiratory events per hour of sleep  during the pressure titration. CPAP was initiated at 4 cm H2O at lights out, 12:02 a.m. It was titrated to the final pressure of 5 cm H2O. REM sleep was observed at the final pressure.  Additionally, the baseline oxygen saturation during wakefulness was 93%, during NREM sleep averaged 92%, and during REM sleep averaged 92%. The total duration of oxygen < 90% was 13.7 minutes and <80% was 0.1 minutes.  Cardiac monitoring- There were no significant cardiac rhythm irregularities.   Periodic limb movement monitoring- demonstrated that there were 159 periodic limb movements for a periodic limb movement index of 24.9 periodic limb movements per hour of sleep.    Impression: This patient's obstructive sleep apnea demonstrated significant improvement with the utilization of nasal CPAP 5 cm H2O.   There was a significantly elevated periodic limb movement index of 24.9 periodic limb movements per hour of sleep. These limb movements were also observed during the initial diagnostic study. Treatment may be indicated if sleep disruption or sleepiness persist once the patient is fully compliant with treatment.  Recommendations: 1. CPAP titration appears to be adequate to control this patients sleep apnea. Would recommend utilization of nasal CPAP at 5 cm H2O.      2. Patient had significant PLMS noted. Would search for RLS clinically. PLMS can increase with CPAP therapy 3. If unable to tolerate CPAP consider ENT evaluation or oral appliance evaluation. 4. A  ResMed Airfit F-20, size small, mask was used. Chin strap used during study-no. Humidifier used during study-heated.     Allyne Gee, MD, [facility] Diplomate ABMS Pulmonary and Critical Care Sleep Medicine  Electronically reviewed and digitally signed      [place] CPAP/BIPAP Polysomnogram Report Part II Phone: [phone] Fax: [phone]  Patient last name  Debbie Hendricks Neck Size 14.0 in. Acquisition [id number]  Patient first name  Debbie Hendricks Weight 155.0 lbs. Started 09/08/2020 at 11:58:07 PM  Birth date 25-Dec-1950 Height 68.0 in. Stopped 09/09/2020 at 6:50:19 AM  Age 79      Type Adult BMI 23.6 lb/in2 Duration 407.4  Jaclynn Major, RPSGT Sleep Data: Lights Out: 12:02:55 AM Sleep Onset: 12:22:07 AM  Lights On: 6:50:19 AM Sleep Efficiency: 93.9 %  Total Recording Time: 407.4 min Sleep Latency (from Lights Off) 19.2 min  Total Sleep Time (TST): 382.7 min R Latency (from Sleep Onset): 170.0 min  Sleep Period Time: 388.2 min Total number of awakenings: 5  Wake during sleep: 5.5 min Wake After Sleep Onset (WASO): 5.5 min   Sleep Data:         Arousal Summary: Stage  Latency from lights out (min) Latency from sleep onset (min) Duration (min) % Total Sleep Time  Normal values  N 1 178.7 159.5 18.5 4.8 (5%)  N 2 19.2 0.0 265.5 69.4 (50%)  N 3       0.0 0.0 (20%)  R 189.2 170.0 98.7 25.8 (25%)    Number Index  Spontaneous 14 2.2  Apneas & Hypopneas 0 0.0  RERAs 0 0.0       (Apneas & Hypopneas & RERAs)  (0) (0.0)  Limb Movement 12 1.9  Snore 0 0.0  TOTAL 26 4.1      Respiratory Data:  CA OA MA Apnea Hypopnea* A+ H RERA Total  Number 0 0 0 0 3 3 0 3  Mean Dur (sec) 0.0 0.0 0.0 0.0 28.5 28.5 0.0 28.5  Max Dur (sec) 0.0 0.0 0.0 0.0 36.0 36.0 0.0 36.0  Total Dur (min) 0.0 0.0 0.0 0.0 1.4 1.4 0.0 1.4  % of TST 0.0 0.0 0.0 0.0 0.4 0.4 0.0 0.4  Index (#/h TST) 0.0 0.0 0.0 0.0 0.5 0.5 0.0 0.5  *Hypopneas scored based on 4% or greater desaturation.  Sleep Stage:         REM NREM TST  AHI 1.8 0.0 0.5  RDI 1.8 0.0 0.5    Sleep (min) TST (%) REM (min) NREM (min) CA (#) OA (#) MA (#) HYP (#) AHI (#/h) RERA (#) RDI (#/h) Desat (#)  Supine 0.0 0.00 0.0 0.0 0 0 0 0 0.0 0 0.00 1  Non-Supine 382.70 100.00 98.70 284.00 0.00 0.00 0.00 3.00 0.47 0 0.47 39.00  Left: 151.1 39.48 78.1 73.0 0 0 0 3 1.2 0 1.2 27  Right: 231.5 60.49 20.5 211.0 0 0 0 0 0.0 0 0.00 12  UP: 0.1 0.03 0.1 0.0 0 0 0 0 0.0 0 0.00 0       Snoring: Total number of snoring episodes  0  Total time with snoring    min (   % of sleep)   Oximetry Distribution:             WK REM NREM TOTAL  Average (%)   93 92 92 92  < 90% 0.9 2.2 10.6 13.7  < 80% 0.0 0.1 0.0 0.1  < 70% 0.0 0.1 0.0 0.1  # of Desaturations* 3 22 15  40  Desat Index (#/hour) 7.3 13.4 3.2 6.3  Desat Max (%) 7 4 11 11   Desat Max Dur (sec) 27.0 120.0 89.0 120.0  Approx Min O2 during sleep 82  Approx min O2 during a respiratory event 87  Was Oxygen added (Y/N) and final rate No:   0 LPM  *Desaturations based on  3% or greater drop from baseline.   Cheyne Stokes Breathing: None Present    Heart Rate Summary:  Average Heart Rate During Sleep 54.9 bpm      Highest Heart Rate During Sleep (95th %) 57.0 bpm      Highest Heart Rate During Sleep 60 bpm      Highest Heart Rate During Recording (TIB) 76 bpm       Heart Rate Observations: Event Type # Events   Bradycardia 0 Lowest HR Scored: N/A  Sinus Tachycardia During Sleep 0 Highest HR Scored: N/A  Narrow Complex Tachycardia 0 Highest HR Scored: N/A  Wide Complex Tachycardia 0 Highest HR Scored: N/A  Asystole 0 Longest Pause: N/A  Atrial Fibrillation 0 Duration Longest Event: N/A  Other Arrythmias  No Type:   Periodic Limb Movement Data: (Primary legs unless otherwise noted) Total # Limb Movement 163 Limb Movement Index 25.6  Total # PLMS 159 PLMS Index 24.9  Total # PLMS Arousals 12 PLMS Arousal Index 1.9  Percentage Sleep Time with PLMS 90.26min (23.7 % sleep)  Mean Duration limb movements (secs) 388.8   IPAP Level (cmH2O) EPAP Level (cmH2O) Total Duration (min) Sleep Duration (min) Sleep (%) REM (%) CA  #) OA # MA # HYP #) AHI (#/hr) RERAs # RERAs (#/hr) RDI (#/hr)  4 4 326.0 320.5 98.3 11.2 0 0 0 3 0.6 0 0.0 0.6  5 5  61.9 61.9 100.0 100.0 0 0 0 0 0.0 0 0.0 0.0

## 2020-10-10 ENCOUNTER — Other Ambulatory Visit: Payer: Self-pay

## 2020-10-10 ENCOUNTER — Ambulatory Visit (INDEPENDENT_AMBULATORY_CARE_PROVIDER_SITE_OTHER): Payer: Medicare Other

## 2020-10-10 ENCOUNTER — Ambulatory Visit
Admission: EM | Admit: 2020-10-10 | Discharge: 2020-10-10 | Disposition: A | Discharge status: Home / Self Care | Payer: Medicare Other | Attending: Physician Assistant | Admitting: Physician Assistant

## 2020-10-10 ENCOUNTER — Encounter: Payer: Self-pay | Admitting: Emergency Medicine

## 2020-10-10 DIAGNOSIS — Z888 Allergy status to other drugs, medicaments and biological substances status: Secondary | ICD-10-CM | POA: Diagnosis not present

## 2020-10-10 DIAGNOSIS — Z885 Allergy status to narcotic agent status: Secondary | ICD-10-CM | POA: Diagnosis not present

## 2020-10-10 DIAGNOSIS — R0602 Shortness of breath: Secondary | ICD-10-CM | POA: Diagnosis not present

## 2020-10-10 DIAGNOSIS — Z9989 Dependence on other enabling machines and devices: Secondary | ICD-10-CM | POA: Diagnosis not present

## 2020-10-10 DIAGNOSIS — R059 Cough, unspecified: Secondary | ICD-10-CM | POA: Diagnosis not present

## 2020-10-10 DIAGNOSIS — Z20822 Contact with and (suspected) exposure to covid-19: Secondary | ICD-10-CM | POA: Insufficient documentation

## 2020-10-10 DIAGNOSIS — F17210 Nicotine dependence, cigarettes, uncomplicated: Secondary | ICD-10-CM | POA: Insufficient documentation

## 2020-10-10 DIAGNOSIS — Z79899 Other long term (current) drug therapy: Secondary | ICD-10-CM | POA: Diagnosis not present

## 2020-10-10 DIAGNOSIS — Z791 Long term (current) use of non-steroidal anti-inflammatories (NSAID): Secondary | ICD-10-CM | POA: Insufficient documentation

## 2020-10-10 DIAGNOSIS — R051 Acute cough: Secondary | ICD-10-CM | POA: Diagnosis present

## 2020-10-10 DIAGNOSIS — Z880 Allergy status to penicillin: Secondary | ICD-10-CM | POA: Insufficient documentation

## 2020-10-10 DIAGNOSIS — Z882 Allergy status to sulfonamides status: Secondary | ICD-10-CM | POA: Insufficient documentation

## 2020-10-10 DIAGNOSIS — Z79890 Hormone replacement therapy (postmenopausal): Secondary | ICD-10-CM | POA: Diagnosis not present

## 2020-10-10 DIAGNOSIS — Z7982 Long term (current) use of aspirin: Secondary | ICD-10-CM | POA: Diagnosis not present

## 2020-10-10 DIAGNOSIS — G4733 Obstructive sleep apnea (adult) (pediatric): Secondary | ICD-10-CM | POA: Diagnosis not present

## 2020-10-10 DIAGNOSIS — Z7984 Long term (current) use of oral hypoglycemic drugs: Secondary | ICD-10-CM | POA: Insufficient documentation

## 2020-10-10 DIAGNOSIS — J40 Bronchitis, not specified as acute or chronic: Secondary | ICD-10-CM | POA: Diagnosis not present

## 2020-10-10 LAB — RESP PANEL BY RT-PCR (FLU A&B, COVID) ARPGX2
Influenza A by PCR: NEGATIVE
Influenza B by PCR: NEGATIVE
SARS Coronavirus 2 by RT PCR: NEGATIVE

## 2020-10-10 MED ORDER — AZITHROMYCIN 250 MG PO TABS: ORAL_TABLET | ORAL | 0 refills | Status: AC

## 2020-10-10 MED ORDER — PREDNISONE 50 MG PO TABS: 50.0000 mg | ORAL_TABLET | Freq: Every day | ORAL | 0 refills | Status: AC

## 2020-10-10 MED ORDER — BENZONATATE 100 MG PO CAPS: 100.0000 mg | ORAL_CAPSULE | Freq: Three times a day (TID) | ORAL | 0 refills | Status: DC | PRN

## 2020-10-10 MED ORDER — ALBUTEROL SULFATE HFA 108 (90 BASE) MCG/ACT IN AERS: 1.0000 | INHALATION_SPRAY | Freq: Four times a day (QID) | RESPIRATORY_TRACT | 0 refills | Status: DC | PRN

## 2020-10-10 NOTE — ED Triage Notes (Addendum)
Patient c/o cough and nasal congestion that started on Friday. States she hasn't been able to sleep due to the cough. Denies fever.

## 2020-10-10 NOTE — Discharge Instructions (Signed)
There is no pneumonia on chest xray . You are negative for covid an influenza.  I have refilled your albuterol inhaler to use for wheezing or shortness of breath .  5 days of prednisone.  Complete course of antibiotics.  Tessalon as needed for cough.  Decrease to quit smoking.  If symptoms worsen or do not improve in the next week to return to be seen or to follow up with your PCP.

## 2020-10-10 NOTE — ED Provider Notes (Signed)
[facility]    CSN: [id number] Arrival date & time: 10/10/20  1536      History   Chief Complaint Chief Complaint  Patient presents with   Cough    HPI Debbie Hendricks is a 69 y.o. female.   Debbie Hendricks presents with complaints of cough which started around 5 days ago, worsening. Difficult time producing thick sputum. Minimal nasal drainage. History of bronchitis with season changes. Smokes cigarettes. Chest wall is sore from coughing. No other chest pain. No known ill contacts. Has gotten her covid-19 vaccine series. Out of her inhalers. Recently diagnosed with OSA and has been using her cpap.     ROS per HPI, negative if not otherwise mentioned.      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      Home Medications    Prior to Admission medications   Medication Sig Start Date End Date Taking? Authorizing Provider  amLODipine (NORVASC) 5 MG tablet Take 5 mg by mouth daily.   Yes [provider]  aspirin 81 MG chewable tablet Chew by mouth daily.   Yes [provider]  atorvastatin (LIPITOR) 20 MG tablet Take 20 mg by mouth daily.   Yes [provider]  clonazePAM (KLONOPIN) 1 MG tablet  Take 1 mg by mouth 3 (three) times daily as needed for anxiety.   Yes [provider]  cyclobenzaprine (FLEXERIL) 5 MG tablet Take by mouth. 07/13/20  Yes [provider]  diclofenac Sodium (VOLTAREN) 1 % GEL Voltaren 1 % topical gel 01/22/20  Yes [provider]  empagliflozin (JARDIANCE) 10 MG TABS tablet Take by mouth. 01/22/20  Yes [provider]  fexofenadine (ALLEGRA) 180 MG tablet Take 1 tablet by mouth daily. 03/11/20 03/11/21 Yes [provider]  fluticasone (FLONASE) 50 MCG/ACT nasal spray Place into both nostrils daily.   Yes [provider]  hydrOXYzine (VISTARIL) 50 MG capsule TAKE 1 3 CAPSULES BY MOUTH AT BEDTIME AS NEEDED FOR SLEEP 02/23/20  Yes [provider]  ipratropium (ATROVENT) 0.03 % nasal spray 2 sprays by Each Nare route Three (3) times a day. 01/22/20 01/21/21 Yes [provider]  levothyroxine (SYNTHROID, LEVOTHROID) 100 MCG tablet Take 125 mcg by mouth daily before breakfast.    Yes [provider]  losartan-hydrochlorothiazide (HYZAAR) 100-25 MG tablet Take 1 tablet by mouth daily.   Yes [provider]  meclizine (ANTIVERT) 25 MG tablet Take 1 tablet (25 mg total) by mouth 3 (three) times daily as needed for dizziness. 05/14/17  Yes Paduchowski, Lennette Bihari, MD  montelukast (SINGULAIR) 10 MG tablet Take 10  mg by mouth at bedtime.   Yes [provider]  nicotine polacrilex (COMMIT) 2 MG lozenge Place inside cheek. 01/22/20  Yes [provider]  ondansetron (ZOFRAN ODT) 8 MG disintegrating tablet Take 1 tablet (8 mg total) by mouth every 8 (eight) hours as needed. 04/29/20  Yes Norval Gable, MD  pantoprazole (PROTONIX) 40 MG tablet Take 40 mg by mouth 2 (two) times daily. 06/13/20  Yes [provider]  phenazopyridine (PYRIDIUM) 200 MG tablet Take 1 tablet (200 mg total) by mouth 3 (three) times daily as needed for pain. 04/21/20  Yes Karen Kitchens, NP  primidone (MYSOLINE) 50 MG  tablet Take by mouth. 08/17/16  Yes [provider]  promethazine (PHENERGAN) 12.5 MG tablet Take by mouth. 08/08/16  Yes [provider]  propranolol (INDERAL) 40 MG tablet Take 40 mg by mouth 2 (two) times daily. 06/13/20  Yes [provider]  QUEtiapine (SEROQUEL) 25 MG tablet Take 12.5-25 mg by mouth at bedtime. 05/18/20  Yes [provider]  SPIRIVA HANDIHALER 18 MCG inhalation capsule INHALE 1 CAPSULE VIA HANDIHALER ONCE DAILY AT THE SAME TIME EVERY DAY 05/29/18  Yes [provider]  sucralfate (CARAFATE) 1 g tablet Take 1 g by mouth 4 (four) times daily. 03/04/20  Yes [provider]  venlafaxine (EFFEXOR) 75 MG tablet Take 75 mg by mouth 2 (two) times daily with a meal.   Yes [provider]  albuterol (PROAIR HFA) 108 (90 Base) MCG/ACT inhaler Inhale 1-2 puffs into the lungs every 6 (six) hours as needed for wheezing or shortness of breath. 10/10/20   Zigmund Gottron, NP  azithromycin (ZITHROMAX) 250 MG tablet Take 2 tablets (500 mg total) by mouth daily for 1 day, THEN 1 tablet (250 mg total) daily for 4 days. 10/10/20 10/15/20  Zigmund Gottron, NP  benzonatate (TESSALON) 100 MG capsule Take 1 capsule (100 mg total) by mouth 3 (three) times daily as needed for cough. 10/10/20   Zigmund Gottron, NP  metFORMIN (GLUCOPHAGE) 500 MG tablet Take 1 pill daily twice daily for diabetes 05/21/19 05/20/20  [provider]  predniSONE (DELTASONE) 50 MG tablet Take 1 tablet (50 mg total) by mouth daily with breakfast for 5 days. 10/10/20 10/15/20  Zigmund Gottron, NP  omeprazole (PRILOSEC) 40 MG capsule Take 40 mg by mouth daily.  10/20/19  [provider]  pravastatin (PRAVACHOL) 10 MG tablet Take 10 mg by mouth daily.  04/21/20  [provider]    Family History Family History  Problem Relation Age of Onset   Hypertension Mother    Thyroid disease Mother    Other Father 9       murdered    Social  History Social History   Tobacco Use   Smoking status: Current Some Day Smoker    Packs/day: 0.50    Types: Cigarettes   Smokeless tobacco: Never Used  Vaping Use   Vaping Use: Never used  Substance Use Topics   Alcohol use: No   Drug use: No     Allergies   Sulfa antibiotics, Amoxicillin, Gabapentin, and Morphine and related   Review of Systems Review of Systems   Physical Exam Triage Vital Signs ED Triage Vitals  Enc Vitals Group     BP 10/10/20 1629 102/77     Pulse Rate 10/10/20 1629 71     Resp 10/10/20 1629 18     Temp 10/10/20 1629 98.2 F (36.8 C)  Temp Source 10/10/20 1629 Oral     SpO2 10/10/20 1629 98 %     Weight 10/10/20 1624 154 lb 15.7 oz (70.3 kg)     Height 10/10/20 1624 5\' 8"  (1.727 m)     Head Circumference --      Peak Flow --      Pain Score 10/10/20 1624 0     Pain Loc --      Pain Edu? --      Excl. in [place]? --    No data found.  Updated Vital Signs BP 102/77 (BP Location: Right Arm)    Pulse 71    Temp 98.2 F (36.8 C) (Oral)    Resp 18    Ht 5\' 8"  (1.727 m)    Wt 154 lb 15.7 oz (70.3 kg)    SpO2 98%    BMI 23.57 kg/m   Visual Acuity Right Eye Distance:   Left Eye Distance:   Bilateral Distance:    Right Eye Near:   Left Eye Near:    Bilateral Near:     Physical Exam Constitutional:      General: She is not in acute distress.    Appearance: She is well-developed.  Cardiovascular:     Rate and Rhythm: Normal rate.  Pulmonary:     Effort: Pulmonary effort is normal.     Breath sounds: Wheezing present.     Comments: Occasional wheezes noted with trigger of cough with deep inspiration  Skin:    General: Skin is warm and dry.  Neurological:     Mental Status: She is alert and oriented to person, place, and time.      UC Treatments / Results  Labs (all labs ordered are listed, but only abnormal results are displayed) Labs Reviewed  RESP PANEL BY RT-PCR (FLU A&B, COVID) [id number]    EKG   Radiology DG Chest  2 View  Result Date: 10/10/2020 CLINICAL DATA:  69 year old female with cough and shortness of breath. EXAM: CHEST - 2 VIEW COMPARISON:  Chest radiograph dated 10/13/2019. FINDINGS: The heart size and mediastinal contours are within normal limits. Both lungs are clear. The visualized skeletal structures are unremarkable. IMPRESSION: No active cardiopulmonary disease. Electronically Signed   By: Anner Crete M.D.   On: 10/10/2020 17:23    Procedures Procedures (including critical care time)  Medications Ordered in UC Medications - No data to display  Initial Impression / Assessment and Plan / UC Course  I have reviewed the triage vital signs and the nursing notes.  Pertinent labs & imaging results that were available during my care of the patient were reviewed by me and considered in my medical decision making (see chart for details).     Bronchitis with prednisone provided, azithromycin and albuterol also provided. Return precautions provided. Encouraged to quit smoking. Patient verbalized understanding and agreeable to plan.   Final Clinical Impressions(s) / UC Diagnoses   Final diagnoses:  Bronchitis     Discharge Instructions     There is no pneumonia on chest xray . You are negative for covid an influenza.  I have refilled your albuterol inhaler to use for wheezing or shortness of breath .  5 days of prednisone.  Complete course of antibiotics.  Tessalon as needed for cough.  Decrease to quit smoking.  If symptoms worsen or do not improve in the next week to return to be seen or to follow up with your PCP.      ED Prescriptions  Medication Sig Dispense Auth. Provider   albuterol (PROAIR HFA) 108 (90 Base) MCG/ACT inhaler Inhale 1-2 puffs into the lungs every 6 (six) hours as needed for wheezing or shortness of breath. 1 each Zigmund Gottron, NP   predniSONE (DELTASONE) 50 MG tablet Take 1 tablet (50 mg total) by mouth daily with breakfast for 5 days. 5 tablet  Augusto Gamble B, NP   azithromycin (ZITHROMAX) 250 MG tablet Take 2 tablets (500 mg total) by mouth daily for 1 day, THEN 1 tablet (250 mg total) daily for 4 days. 6 tablet Augusto Gamble B, NP   benzonatate (TESSALON) 100 MG capsule Take 1 capsule (100 mg total) by mouth 3 (three) times daily as needed for cough. 21 capsule Zigmund Gottron, NP     PDMP not reviewed this encounter.   Zigmund Gottron, NP 10/10/20 1810

## 2020-10-11 ENCOUNTER — Telehealth: Payer: Self-pay | Admitting: Family Medicine

## 2020-10-11 MED ORDER — PSEUDOEPH-BROMPHEN-DM 30-2-10 MG/5ML PO SYRP: 5.0000 mL | ORAL_SOLUTION | Freq: Four times a day (QID) | ORAL | 0 refills | Status: DC | PRN

## 2020-10-11 NOTE — Telephone Encounter (Signed)
Requesting Rx for Bromfed. Rx sent.  [facility]

## 2020-10-24 ENCOUNTER — Ambulatory Visit
Admission: EM | Admit: 2020-10-24 | Discharge: 2020-10-24 | Disposition: A | Discharge status: Home / Self Care | Payer: PRIVATE HEALTH INSURANCE | Attending: Physician Assistant | Admitting: Physician Assistant

## 2020-10-24 ENCOUNTER — Other Ambulatory Visit: Payer: Self-pay

## 2020-10-24 ENCOUNTER — Encounter: Payer: Self-pay | Admitting: Emergency Medicine

## 2020-10-24 ENCOUNTER — Ambulatory Visit (INDEPENDENT_AMBULATORY_CARE_PROVIDER_SITE_OTHER): Payer: PRIVATE HEALTH INSURANCE

## 2020-10-24 DIAGNOSIS — J3489 Other specified disorders of nose and nasal sinuses: Secondary | ICD-10-CM

## 2020-10-24 DIAGNOSIS — R059 Cough, unspecified: Secondary | ICD-10-CM

## 2020-10-24 DIAGNOSIS — J019 Acute sinusitis, unspecified: Secondary | ICD-10-CM | POA: Diagnosis not present

## 2020-10-24 MED ORDER — PSEUDOEPH-BROMPHEN-DM 30-2-10 MG/5ML PO SYRP: 5.0000 mL | ORAL_SOLUTION | Freq: Four times a day (QID) | ORAL | 0 refills | Status: AC | PRN

## 2020-10-24 MED ORDER — DOXYCYCLINE HYCLATE 100 MG PO CAPS: 100.0000 mg | ORAL_CAPSULE | Freq: Two times a day (BID) | ORAL | 0 refills | Status: AC

## 2020-10-24 NOTE — Discharge Instructions (Signed)
SINUSITIS: Continue use of a nasal saline irrigation system. May consider use of intranasal steroids, such as Flonase as well. Use medications as directed. If antibiotics are prescribed, take the full course of antibiotics. Also consider use of Sudafed or Mucinex D. Increase rest, fluids. If you do not improve or if you worsen after a course of antibiotics, you should be re-examined. You may need a different antibiotic or further evaluation with imaging or an exam of the inside of the sinuses

## 2020-10-24 NOTE — ED Triage Notes (Signed)
Pt c/o cough, sinus pressure. She was seen on 10/10/20 for the same thing. She was given Zpak and cough syrup and got a little better. She states she is feeling worse.

## 2020-10-24 NOTE — ED Provider Notes (Signed)
[facility]    CSN: [id number] Arrival date & time: 10/24/20  1459      History   Chief Complaint Chief Complaint  Patient presents with  . Cough    HPI Debbie Hendricks is a 69 y.o. female presenting for 3-week history of cough and sinus pain. Patient was seen in the clinic on 10/10/2020 and treated for bronchitis with azithromycin, prednisone, Tessalon Perles, and albuterol.   Patient says that her symptoms did not really get better with azithromycin.  She states that Dr. Lacinda Axon called Bromfed DM syrup and for her and that seemed to help her cough, but she is out now.  Patient states that her sinus pain and pressure has worsened.  Patient states that she is allergic to multiple antibiotics.  Patient denies any associated fever, weakness, or breathing difficulty.  Medical history significant for hypertension, hyperlipidemia, diabetes, thyroid disease, and obstructive sleep apnea (recently diagnosed the past couple weeks).  She has no other concerns today.  HPI  Past Medical History:  Diagnosis Date  . Anxiety   . Cancer ([place])   . Diabetes mellitus without complication ([place])   . Femoral nerve injury   . Hearing loss    right side  . Hyperlipidemia   . Hypertension   . OSA (obstructive sleep apnea)   . Thyroid disease     Patient Active Problem List   Diagnosis Date Noted  . OSA (obstructive sleep apnea)   . Other insomnia 08/03/2020  . Nocturnal hypoxia 08/03/2020  . Other hypersomnia 08/03/2020  . GERD (gastroesophageal reflux disease) 08/01/2020  . HTN (hypertension) 08/01/2020    Past Surgical History:  Procedure Laterality Date  . ABDOMINAL HYSTERECTOMY    . ACOUSTIC NEUROMA RESECTION    . BLADDER SURGERY    . CARDIAC CATHETERIZATION    . ECTOPIC PREGNANCY SURGERY      OB History   No obstetric history on file.      Home Medications    Prior to Admission medications   Medication Sig Start Date End Date Taking? Authorizing Provider  albuterol  (PROAIR HFA) 108 (90 Base) MCG/ACT inhaler Inhale 1-2 puffs into the lungs every 6 (six) hours as needed for wheezing or shortness of breath. 10/10/20  Yes Burky, Lanelle Bal B, NP  amLODipine (NORVASC) 5 MG tablet Take 5 mg by mouth daily.   Yes [provider]  aspirin 81 MG chewable tablet Chew by mouth daily.   Yes [provider]  atorvastatin (LIPITOR) 20 MG tablet Take 20 mg by mouth daily.   Yes [provider]  cyclobenzaprine (FLEXERIL) 5 MG tablet Take by mouth. 07/13/20  Yes [provider]  diclofenac Sodium (VOLTAREN) 1 % GEL Voltaren 1 % topical gel 01/22/20  Yes [provider]  fexofenadine (ALLEGRA) 180 MG tablet Take 1 tablet by mouth daily. 03/11/20 03/11/21 Yes [provider]  fluticasone (FLONASE) 50 MCG/ACT nasal spray Place into both nostrils daily.   Yes [provider]  levothyroxine (SYNTHROID, LEVOTHROID) 100 MCG tablet Take 125 mcg by mouth daily before breakfast.    Yes [provider]  losartan-hydrochlorothiazide (HYZAAR) 100-25 MG tablet Take 1 tablet by mouth daily.   Yes [provider]  montelukast (SINGULAIR) 10 MG tablet Take 10 mg by mouth at bedtime.   Yes [provider]  pantoprazole (PROTONIX) 40 MG tablet Take 40 mg by mouth 2 (two) times daily. 06/13/20  Yes [provider]  primidone (MYSOLINE) 50 MG tablet Take  by mouth. 08/17/16  Yes [provider]  promethazine (PHENERGAN) 12.5 MG tablet Take by mouth. 08/08/16  Yes [provider]  propranolol (INDERAL) 40 MG tablet Take 40 mg by mouth 2 (two) times daily. 06/13/20  Yes [provider]  QUEtiapine (SEROQUEL) 25 MG tablet Take 12.5-25 mg by mouth at bedtime. 05/18/20  Yes [provider]  SPIRIVA HANDIHALER 18 MCG inhalation capsule INHALE 1 CAPSULE VIA HANDIHALER ONCE DAILY AT THE SAME TIME EVERY DAY 05/29/18  Yes [provider]  sucralfate (CARAFATE) 1 g tablet Take 1 g  by mouth 4 (four) times daily. 03/04/20  Yes [provider]  venlafaxine (EFFEXOR) 75 MG tablet Take 75 mg by mouth 2 (two) times daily with a meal.   Yes [provider]  brompheniramine-pseudoephedrine-DM 30-2-10 MG/5ML syrup Take 5 mLs by mouth 4 (four) times daily as needed for up to 7 days. 10/24/20 10/31/20  Danton Clap, PA-C  clonazePAM (KLONOPIN) 1 MG tablet Take 1 mg by mouth 3 (three) times daily as needed for anxiety.    [provider]  doxycycline (VIBRAMYCIN) 100 MG capsule Take 1 capsule (100 mg total) by mouth 2 (two) times daily for 7 days. 10/24/20 10/31/20  Laurene Footman B, PA-C  empagliflozin (JARDIANCE) 10 MG TABS tablet Take by mouth. 01/22/20   [provider]  hydrOXYzine (VISTARIL) 50 MG capsule TAKE 1 3 CAPSULES BY MOUTH AT BEDTIME AS NEEDED FOR SLEEP 02/23/20   [provider]  ipratropium (ATROVENT) 0.03 % nasal spray 2 sprays by Each Nare route Three (3) times a day. 01/22/20 01/21/21  [provider]  meclizine (ANTIVERT) 25 MG tablet Take 1 tablet (25 mg total) by mouth 3 (three) times daily as needed for dizziness. 05/14/17   Harvest Dark, MD  metFORMIN (GLUCOPHAGE) 500 MG tablet Take 1 pill daily twice daily for diabetes 05/21/19 05/20/20  [provider]  nicotine polacrilex (COMMIT) 2 MG lozenge Place inside cheek. 01/22/20   [provider]  ondansetron (ZOFRAN ODT) 8 MG disintegrating tablet Take 1 tablet (8 mg total) by mouth every 8 (eight) hours as needed. 04/29/20   Norval Gable, MD  phenazopyridine (PYRIDIUM) 200 MG tablet Take 1 tablet (200 mg total) by mouth 3 (three) times daily as needed for pain. 04/21/20   Karen Kitchens, NP  omeprazole (PRILOSEC) 40 MG capsule Take 40 mg by mouth daily.  10/20/19  [provider]  pravastatin (PRAVACHOL) 10 MG tablet Take 10 mg by mouth daily.  04/21/20  [provider]    Family History Family History  Problem Relation Age of Onset  .  Hypertension Mother   . Thyroid disease Mother   . Other Father 56       murdered    Social History Social History   Tobacco Use  . Smoking status: Current Some Day Smoker    Packs/day: 0.50    Types: Cigarettes  . Smokeless tobacco: Never Used  Vaping Use  . Vaping Use: Never used  Substance Use Topics  . Alcohol use: No  . Drug use: No     Allergies   Sulfa antibiotics, Amoxicillin, Gabapentin, and Morphine and related   Review of Systems Review of Systems  Constitutional: Positive for fatigue. Negative for chills, diaphoresis and fever.  HENT: Positive for congestion, rhinorrhea, sinus pressure and sinus pain. Negative for ear pain and sore throat.   Respiratory: Positive for cough. Negative for shortness of breath and wheezing.   Cardiovascular:  Negative for chest pain.  Gastrointestinal: Negative for abdominal pain, nausea and vomiting.  Musculoskeletal: Negative for arthralgias and myalgias.  Skin: Negative for rash.  Neurological: Positive for headaches. Negative for weakness.  Hematological: Negative for adenopathy.     Physical Exam Triage Vital Signs ED Triage Vitals  Enc Vitals Group     BP 10/24/20 1534 122/78     Pulse Rate 10/24/20 1534 71     Resp 10/24/20 1534 18     Temp 10/24/20 1534 98.9 F (37.2 C)     Temp Source 10/24/20 1534 Oral     SpO2 10/24/20 1534 98 %     Weight 10/24/20 1529 154 lb 15.7 oz (70.3 kg)     Height 10/24/20 1529 5\' 8"  (1.727 m)     Head Circumference --      Peak Flow --      Pain Score 10/24/20 1529 9     Pain Loc --      Pain Edu? --      Excl. in [place]? --    No data found.  Updated Vital Signs BP 122/78 (BP Location: Left Arm)   Pulse 71   Temp 98.9 F (37.2 C) (Oral)   Resp 18   Ht 5\' 8"  (1.727 m)   Wt 154 lb 15.7 oz (70.3 kg)   SpO2 98%   BMI 23.57 kg/m       Physical Exam Vitals and nursing note reviewed.  Constitutional:      General: She is not in acute distress.    Appearance: Normal  appearance. She is not ill-appearing or toxic-appearing.  HENT:     Head: Normocephalic and atraumatic.     Nose: Congestion and rhinorrhea (moderate thick yellow drainage with right sided maxillary tenderness) present.     Mouth/Throat:     Mouth: Mucous membranes are moist.     Pharynx: Oropharynx is clear.  Eyes:     General: No scleral icterus.       Right eye: No discharge.        Left eye: No discharge.     Conjunctiva/sclera: Conjunctivae normal.  Cardiovascular:     Rate and Rhythm: Normal rate and regular rhythm.     Heart sounds: Normal heart sounds.  Pulmonary:     Effort: Pulmonary effort is normal. No respiratory distress.     Breath sounds: Normal breath sounds. No wheezing, rhonchi or rales.  Musculoskeletal:     Cervical back: Neck supple.  Skin:    General: Skin is dry.  Neurological:     General: No focal deficit present.     Mental Status: She is alert. Mental status is at baseline.     Motor: No weakness.     Gait: Gait normal.  Psychiatric:        Mood and Affect: Mood normal.        Behavior: Behavior normal.        Thought Content: Thought content normal.      UC Treatments / Results  Labs (all labs ordered are listed, but only abnormal results are displayed) Labs Reviewed - No data to display  EKG   Radiology DG Chest 2 View  Result Date: 10/24/2020 CLINICAL DATA:  Cough and sinus pressure EXAM: CHEST - 2 VIEW COMPARISON:  10/10/2020 FINDINGS: The heart size and mediastinal contours are within normal limits. Both lungs are clear. The visualized skeletal structures are unremarkable. IMPRESSION: No active cardiopulmonary disease. Electronically Signed   By: Juanda Crumble  Carlis Abbott M.D.   On: 10/24/2020 16:27    Procedures Procedures (including critical care time)  Medications Ordered in UC Medications - No data to display  Initial Impression / Assessment and Plan / UC Course  I have reviewed the triage vital signs and the nursing  notes.  Pertinent labs & imaging results that were available during my care of the patient were reviewed by me and considered in my medical decision making (see chart for details).    69 year old female returning to urgent care for 3-week history of cough and sinus pain.  She was seen on 10/10/2020 and treated for suspected bronchitis.  She took azithromycin and finish the full course.  Patient states symptoms did not improve and seem to be getting worse.  Vital signs are all stable today.  She is afebrile.  Oxygen 98%.  On exam, she has right-sided maxillary tenderness.  Chest is clear to auscultation.  Chest x-ray was performed which was within normal limits.  Treating for suspected sinus infection with doxycycline since she has allergy to amoxicillin.  Advised her that if she does not get better with this medication then she will need to follow-up with ENT specialist.  Also sent a few more days of Bromfed cough syrup.  Advised her to increase rest and fluids.  ED precautions reviewed.   Final Clinical Impressions(s) / UC Diagnoses   Final diagnoses:  Acute sinusitis, recurrence not specified, unspecified location  Cough     Discharge Instructions     SINUSITIS: Continue use of a nasal saline irrigation system. May consider use of intranasal steroids, such as Flonase as well. Use medications as directed. If antibiotics are prescribed, take the full course of antibiotics. Also consider use of Sudafed or Mucinex D. Increase rest, fluids. If you do not improve or if you worsen after a course of antibiotics, you should be re-examined. You may need a different antibiotic or further evaluation with imaging or an exam of the inside of the sinuses     ED Prescriptions    Medication Sig Dispense Auth. Provider   brompheniramine-pseudoephedrine-DM 30-2-10 MG/5ML syrup Take 5 mLs by mouth 4 (four) times daily as needed for up to 7 days. 150 mL Laurene Footman B, PA-C   doxycycline (VIBRAMYCIN) 100 MG  capsule Take 1 capsule (100 mg total) by mouth 2 (two) times daily for 7 days. 14 capsule Danton Clap, PA-C     I have reviewed the PDMP during this encounter.   Danton Clap, PA-C 10/25/20 [phone]

## 2020-12-05 ENCOUNTER — Ambulatory Visit: Payer: Medicare Other

## 2020-12-16 NOTE — Progress Notes (Deleted)
[facility] [place], [place] [address]  Pulmonary Sleep Medicine   Office Visit Note  Patient Name: Debbie Hendricks DOB: [birth year] MRN [id number]    Chief Complaint: Obstructive Sleep Apnea visit  Brief History:  Debbie Hendricks is seen today for *** The patient has a *** history of sleep apnea. Patient *** using PAP nightly.  The patient feels *** after sleeping with PAP.  The patient reports *** from PAP use. Reported sleepiness is  *** and the Epworth Sleepiness Score is *** out of 24. The patient *** take naps. The patient complains of the following: ***  The compliance download shows  compliance with an average use time of *** hours. The AHI is ***  The patient *** of limb movements disrupting sleep.  ROS  General: (-) fever, (-) chills, (-) night sweat Nose and Sinuses: (-) nasal stuffiness or itchiness, (-) postnasal drip, (-) nosebleeds, (-) sinus trouble. Mouth and Throat: (-) sore throat, (-) hoarseness. Neck: (-) swollen glands, (-) enlarged thyroid, (-) neck pain. Respiratory: *** cough, *** shortness of breath, *** wheezing. Neurologic: *** numbness, *** tingling. Psychiatric: *** anxiety, *** depression   Current Medication: Outpatient Encounter Medications as of 12/19/2020  Medication Sig Note  . albuterol (PROAIR HFA) 108 (90 Base) MCG/ACT inhaler Inhale 1-2 puffs into the lungs every 6 (six) hours as needed for wheezing or shortness of breath.   Marland Kitchen amLODipine (NORVASC) 5 MG tablet Take 5 mg by mouth daily.   Marland Kitchen aspirin 81 MG chewable tablet Chew by mouth daily.   Marland Kitchen atorvastatin (LIPITOR) 20 MG tablet Take 20 mg by mouth daily.   . clonazePAM (KLONOPIN) 1 MG tablet Take 1 mg by mouth 3 (three) times daily as needed for anxiety.   . cyclobenzaprine (FLEXERIL) 5 MG tablet Take by mouth.   . diclofenac Sodium (VOLTAREN) 1 % GEL Voltaren 1 % topical gel   . empagliflozin (JARDIANCE) 10 MG TABS tablet Take by mouth.   . fexofenadine (ALLEGRA) 180 MG  tablet Take 1 tablet by mouth daily.   . fluticasone (FLONASE) 50 MCG/ACT nasal spray Place into both nostrils daily. 02/05/2019: prn  . hydrOXYzine (VISTARIL) 50 MG capsule TAKE 1 3 CAPSULES BY MOUTH AT BEDTIME AS NEEDED FOR SLEEP   . ipratropium (ATROVENT) 0.03 % nasal spray 2 sprays by Each Nare route Three (3) times a day.   . levothyroxine (SYNTHROID, LEVOTHROID) 100 MCG tablet Take 125 mcg by mouth daily before breakfast.    . losartan-hydrochlorothiazide (HYZAAR) 100-25 MG tablet Take 1 tablet by mouth daily.   . meclizine (ANTIVERT) 25 MG tablet Take 1 tablet (25 mg total) by mouth 3 (three) times daily as needed for dizziness. 10/20/2019: PRN  . metFORMIN (GLUCOPHAGE) 500 MG tablet Take 1 pill daily twice daily for diabetes   . montelukast (SINGULAIR) 10 MG tablet Take 10 mg by mouth at bedtime.   . nicotine polacrilex (COMMIT) 2 MG lozenge Place inside cheek.   . ondansetron (ZOFRAN ODT) 8 MG disintegrating tablet Take 1 tablet (8 mg total) by mouth every 8 (eight) hours as needed.   . pantoprazole (PROTONIX) 40 MG tablet Take 40 mg by mouth 2 (two) times daily.   . phenazopyridine (PYRIDIUM) 200 MG tablet Take 1 tablet (200 mg total) by mouth 3 (three) times daily as needed for pain.   . primidone (MYSOLINE) 50 MG tablet Take by mouth.   . promethazine (PHENERGAN) 12.5 MG tablet Take by mouth.   . propranolol (INDERAL)  40 MG tablet Take 40 mg by mouth 2 (two) times daily.   . QUEtiapine (SEROQUEL) 25 MG tablet Take 12.5-25 mg by mouth at bedtime.   Marland Kitchen SPIRIVA HANDIHALER 18 MCG inhalation capsule INHALE 1 CAPSULE VIA HANDIHALER ONCE DAILY AT THE SAME TIME EVERY DAY   . sucralfate (CARAFATE) 1 g tablet Take 1 g by mouth 4 (four) times daily.   Marland Kitchen venlafaxine (EFFEXOR) 75 MG tablet Take 75 mg by mouth 2 (two) times daily with a meal.   . [DISCONTINUED] omeprazole (PRILOSEC) 40 MG capsule Take 40 mg by mouth daily.   . [DISCONTINUED] pravastatin (PRAVACHOL) 10 MG tablet Take 10 mg by mouth  daily.    No facility-administered encounter medications on file as of 12/19/2020.    Surgical History: Past Surgical History:  Procedure Laterality Date  . ABDOMINAL HYSTERECTOMY    . ACOUSTIC NEUROMA RESECTION    . BLADDER SURGERY    . CARDIAC CATHETERIZATION    . ECTOPIC PREGNANCY SURGERY      Medical History: Past Medical History:  Diagnosis Date  . Anxiety   . Cancer ([place])   . Diabetes mellitus without complication ([place])   . Femoral nerve injury   . Hearing loss    right side  . Hyperlipidemia   . Hypertension   . OSA (obstructive sleep apnea)   . Thyroid disease     Family History: Non contributory to the present illness  Social History: Social History   Socioeconomic History  . Marital status: Single    Spouse name: Not on file  . Number of children: Not on file  . Years of education: Not on file  . Highest education level: Not on file  Occupational History  . Not on file  Tobacco Use  . Smoking status: Current Some Day Smoker    Packs/day: 0.50    Types: Cigarettes  . Smokeless tobacco: Never Used  Vaping Use  . Vaping Use: Never used  Substance and Sexual Activity  . Alcohol use: No  . Drug use: No  . Sexual activity: Not on file  Other Topics Concern  . Not on file  Social History Narrative  . Not on file   Social Determinants of Health   Financial Resource Strain: Not on file  Food Insecurity: Not on file  Transportation Needs: Not on file  Physical Activity: Not on file  Stress: Not on file  Social Connections: Not on file  Intimate Partner Violence: Not on file    Vital Signs: There were no vitals taken for this visit.  Examination: General Appearance: The patient is well-developed, well-nourished, and in no distress. Neck Circumference: *** Skin: Gross inspection of skin unremarkable. Head: normocephalic, no gross deformities. Eyes: no gross deformities noted. ENT: ears appear grossly normal Neurologic: Alert and oriented.  No involuntary movements.    EPWORTH SLEEPINESS SCALE:  Scale:  (0)= no chance of dozing; (1)= slight chance of dozing; (2)= moderate chance of dozing; (3)= high chance of dozing  Chance  Situtation    Sitting and reading: ***    Watching TV: ***    Sitting Inactive in public: ***    As a passenger in car: ***      Lying down to rest: ***    Sitting and talking: ***    Sitting quielty after lunch: ***    In a car, stopped in traffic: ***   TOTAL SCORE:   *** out of 24    SLEEP STUDIES:  1.  PSG 08/18/20 - AHI 7.3, Supine REM 20.6, Low SpO 84%; titration   CPAP COMPLIANCE DATA:  Date Range: 11/16/20 - 12/15/20  Average Daily Use: 8:32 hours  Median Use: 9:06  Compliance for > 4 Hours: 97%  AHI: 0.3 respiratory events per hour  Days Used: 29/30  Mask Leak: 35.7  95th Percentile Pressure: 5 cmH2O         LABS: Recent Results (from the past 2160 hour(s))  Resp Panel by RT-PCR (Flu A&B, Covid) Nasopharyngeal Swab     Status: None   Collection Time: 10/10/20  4:31 PM   Specimen: Nasopharyngeal Swab; Nasopharyngeal(NP) swabs in vial transport medium  Result Value Ref Range   SARS Coronavirus 2 by RT PCR NEGATIVE NEGATIVE    Comment: (NOTE) SARS-CoV-2 target nucleic acids are NOT DETECTED.  The SARS-CoV-2 RNA is generally detectable in upper respiratory specimens during the acute phase of infection. The lowest concentration of SARS-CoV-2 viral copies this assay can detect is 138 copies/mL. A negative result does not preclude SARS-Cov-2 infection and should not be used as the sole basis for treatment or other patient management decisions. A negative result may occur with  improper specimen collection/handling, submission of specimen other than nasopharyngeal swab, presence of viral mutation(s) within the areas targeted by this assay, and inadequate number of viral copies(<138 copies/mL). A negative result must be combined with clinical  observations, patient history, and epidemiological information. The expected result is Negative.  Fact Sheet for Patients:  EntrepreneurPulse.com.au  Fact Sheet for Healthcare Providers:  IncredibleEmployment.be  This test is no t yet approved or cleared by the [place] FDA and  has been authorized for detection and/or diagnosis of SARS-CoV-2 by FDA under an Emergency Use Authorization (EUA). This EUA will remain  in effect (meaning this test can be used) for the duration of the COVID-19 declaration under Section 564(b)(1) of the Act, 21 U.S.C.section 360bbb-3(b)(1), unless the authorization is terminated  or revoked sooner.       Influenza A by PCR NEGATIVE NEGATIVE   Influenza B by PCR NEGATIVE NEGATIVE    Comment: (NOTE) The Xpert Xpress SARS-CoV-2/FLU/RSV plus assay is intended as an aid in the diagnosis of influenza from Nasopharyngeal swab specimens and should not be used as a sole basis for treatment. Nasal washings and aspirates are unacceptable for Xpert Xpress SARS-CoV-2/FLU/RSV testing.  Fact Sheet for Patients: EntrepreneurPulse.com.au  Fact Sheet for Healthcare Providers: IncredibleEmployment.be  This test is not yet approved or cleared by the [place] FDA and has been authorized for detection and/or diagnosis of SARS-CoV-2 by FDA under an Emergency Use Authorization (EUA). This EUA will remain in effect (meaning this test can be used) for the duration of the COVID-19 declaration under Section 564(b)(1) of the Act, 21 U.S.C. section 360bbb-3(b)(1), unless the authorization is terminated or revoked.  Performed at [facility], [address] [place], [place] [address]     Radiology: DG Chest 2 View  Result Date: 10/24/2020 CLINICAL DATA:  Cough and sinus pressure EXAM: CHEST - 2 VIEW COMPARISON:  10/10/2020 FINDINGS: The heart size and mediastinal contours are  within normal limits. Both lungs are clear. The visualized skeletal structures are unremarkable. IMPRESSION: No active cardiopulmonary disease. Electronically Signed   By: Franchot Gallo M.D.   On: 10/24/2020 16:27    No results found.  No results found.    Assessment and Plan: Patient Active Problem List   Diagnosis Date Noted  . OSA (obstructive sleep apnea)   .  Other insomnia 08/03/2020  . Nocturnal hypoxia 08/03/2020  . Other hypersomnia 08/03/2020  . GERD (gastroesophageal reflux disease) 08/01/2020  . HTN (hypertension) 08/01/2020      The patient *** tolerate PAP and reports *** benefit from PAP use. The patient was reminded how to *** and advised to ***. The patient was also counselled on ***. The compliance is ***. The AHI is ***.   1. ***  General Counseling: I have discussed the findings of the evaluation and examination with Debbie Hendricks.  I have also discussed any further diagnostic evaluation thatmay be needed or ordered today. Debbie Hendricks verbalizes understanding of the findings of todays visit. We also reviewed her medications today and discussed drug interactions and side effects including but not limited excessive drowsiness and altered mental states. We also discussed that there is always a risk not just to her but also people around her. she has been encouraged to call the office with any questions or concerns that should arise related to todays visit.  No orders of the defined types were placed in this encounter.       I have personally obtained a history, examined the patient, evaluated laboratory and imaging results, formulated the assessment and plan and placed orders.   Richelle Ito Saunders Glance, PhD, FAASM  Diplomate, American Board of Sleep Medicine    Allyne Gee, MD [facility] Diplomate ABMS Pulmonary and Critical Care Medicine Sleep medicine

## 2020-12-19 ENCOUNTER — Ambulatory Visit: Payer: Self-pay

## 2021-01-16 ENCOUNTER — Ambulatory Visit: Payer: Medicare Other | Admitting: Internal Medicine

## 2021-01-16 NOTE — Progress Notes (Signed)
No show for appointment. Office will call to reschedule.  

## 2021-04-03 ENCOUNTER — Ambulatory Visit
Admission: EM | Admit: 2021-04-03 | Discharge: 2021-04-03 | Disposition: A | Discharge status: Home / Self Care | Payer: Medicare Other | Attending: Family Medicine | Admitting: Family Medicine

## 2021-04-03 ENCOUNTER — Ambulatory Visit (INDEPENDENT_AMBULATORY_CARE_PROVIDER_SITE_OTHER): Payer: Medicare Other

## 2021-04-03 DIAGNOSIS — Z20822 Contact with and (suspected) exposure to covid-19: Secondary | ICD-10-CM | POA: Insufficient documentation

## 2021-04-03 DIAGNOSIS — J441 Chronic obstructive pulmonary disease with (acute) exacerbation: Secondary | ICD-10-CM | POA: Insufficient documentation

## 2021-04-03 DIAGNOSIS — J189 Pneumonia, unspecified organism: Secondary | ICD-10-CM | POA: Insufficient documentation

## 2021-04-03 DIAGNOSIS — Z79899 Other long term (current) drug therapy: Secondary | ICD-10-CM | POA: Diagnosis not present

## 2021-04-03 DIAGNOSIS — I10 Essential (primary) hypertension: Secondary | ICD-10-CM | POA: Insufficient documentation

## 2021-04-03 DIAGNOSIS — R509 Fever, unspecified: Secondary | ICD-10-CM | POA: Diagnosis not present

## 2021-04-03 DIAGNOSIS — R059 Cough, unspecified: Secondary | ICD-10-CM

## 2021-04-03 DIAGNOSIS — F17210 Nicotine dependence, cigarettes, uncomplicated: Secondary | ICD-10-CM | POA: Diagnosis not present

## 2021-04-03 DIAGNOSIS — G4733 Obstructive sleep apnea (adult) (pediatric): Secondary | ICD-10-CM | POA: Insufficient documentation

## 2021-04-03 LAB — RESP PANEL BY RT-PCR (FLU A&B, COVID) ARPGX2
Influenza A by PCR: NEGATIVE
Influenza B by PCR: NEGATIVE
SARS Coronavirus 2 by RT PCR: NEGATIVE

## 2021-04-03 MED ORDER — AZITHROMYCIN 250 MG PO TABS: ORAL_TABLET | ORAL | 0 refills | Status: DC

## 2021-04-03 MED ORDER — DOXYCYCLINE HYCLATE 100 MG PO CAPS: 100.0000 mg | ORAL_CAPSULE | Freq: Two times a day (BID) | ORAL | 0 refills | Status: DC

## 2021-04-03 MED ORDER — PSEUDOEPH-BROMPHEN-DM 30-2-10 MG/5ML PO SYRP: 5.0000 mL | ORAL_SOLUTION | Freq: Four times a day (QID) | ORAL | 0 refills | Status: DC | PRN

## 2021-04-03 MED ORDER — PREDNISONE 50 MG PO TABS: ORAL_TABLET | ORAL | 0 refills | Status: DC

## 2021-04-03 MED ORDER — LEVOFLOXACIN 750 MG PO TABS: 750.0000 mg | ORAL_TABLET | Freq: Every day | ORAL | 0 refills | Status: DC

## 2021-04-03 NOTE — ED Triage Notes (Addendum)
Pt c/o sinus congestion, headache, cough for several days. Pt also reports fever 102, 2-3 days ago. Pt has been taking Allegra, Sudafed and other OTC meds with no improvement. Pt has also Phenergan for nausea. Pt denies f/v/d or other symptoms. Pt is having trouble sleeping d/t the coughing.

## 2021-04-03 NOTE — ED Provider Notes (Signed)
[facility]    CSN: [id number] Arrival date & time: 04/03/21  1712  History   Chief Complaint Chief Complaint  Patient presents with  . Nasal Congestion  . Cough   HPI  70 year old female presents with respiratory symptoms.  Symptoms started last week. She reports cough, congestion, and headache. Mild SOB. Thick sputum. Had a fever 2-3 days ago (T max 102). She has taken OTC Allegra, Sudafed, Mucinex without improvement. Cough is worse at night. No other complaints.  Past Medical History:  Diagnosis Date  . Anxiety   . Cancer ([place])   . Diabetes mellitus without complication ([place])   . Femoral nerve injury   . Hearing loss    right side  . Hyperlipidemia   . Hypertension   . OSA (obstructive sleep apnea)   . Thyroid disease     Patient Active Problem List   Diagnosis Date Noted  . OSA (obstructive sleep apnea)   . Other insomnia 08/03/2020  . Nocturnal hypoxia 08/03/2020  . Other hypersomnia 08/03/2020  . GERD (gastroesophageal reflux disease) 08/01/2020  . HTN (hypertension) 08/01/2020    Past Surgical History:  Procedure Laterality Date  . ABDOMINAL HYSTERECTOMY    . ACOUSTIC NEUROMA RESECTION    . BLADDER SURGERY    . CARDIAC CATHETERIZATION    . ECTOPIC PREGNANCY SURGERY      OB History   No obstetric history on file.      Home Medications    Prior to Admission medications   Medication Sig Start Date End Date Taking? Authorizing Provider  albuterol (PROAIR HFA) 108 (90 Base) MCG/ACT inhaler Inhale 1-2 puffs into the lungs every 6 (six) hours as needed for wheezing or shortness of breath. 10/10/20  Yes Burky, Lanelle Bal B, NP  amLODipine (NORVASC) 5 MG tablet Take 5 mg by mouth daily.   Yes [provider]  aspirin 81 MG chewable tablet Chew by mouth daily.   Yes [provider]  atorvastatin (LIPITOR) 20 MG tablet Take 20 mg by mouth daily.   Yes [provider]  azithromycin (ZITHROMAX) 250 MG tablet 2 tablets on  day 1, then 1 tablet daily on days 2-5. Discontinue/Cancel Rx for Doxycyline and Prednisone. 04/03/21  Yes Takenya Travaglini G, DO  brompheniramine-pseudoephedrine-DM 30-2-10 MG/5ML syrup Take 5 mLs by mouth 4 (four) times daily as needed. 04/03/21  Yes Latorya Bautch G, DO  clonazePAM (KLONOPIN) 1 MG tablet Take 1 mg by mouth 3 (three) times daily as needed for anxiety.   Yes [provider]  cyclobenzaprine (FLEXERIL) 5 MG tablet Take by mouth. 07/13/20  Yes [provider]  fexofenadine (ALLEGRA) 180 MG tablet Take 1 tablet by mouth daily. 03/11/20 04/03/21 Yes [provider]  fluticasone (FLONASE) 50 MCG/ACT nasal spray Place into both nostrils daily.   Yes [provider]  hydrOXYzine (VISTARIL) 50 MG capsule TAKE 1 3 CAPSULES BY MOUTH AT BEDTIME AS NEEDED FOR SLEEP 02/23/20  Yes [provider]  ipratropium (ATROVENT) 0.03 % nasal spray 2 sprays by Each Nare route Three (3) times a day. 01/22/20 04/03/21 Yes [provider]  levofloxacin (LEVAQUIN) 750 MG tablet Take 1 tablet (750 mg total) by mouth daily. 04/03/21  Yes Marcial Pless G, DO  losartan-hydrochlorothiazide (HYZAAR) 100-25 MG tablet Take 1 tablet by mouth daily.   Yes [provider]  meclizine (ANTIVERT) 25 MG tablet Take 1 tablet (25 mg total) by mouth 3 (three) times daily as needed for dizziness. 05/14/17  Yes Harvest Dark, MD  montelukast (SINGULAIR) 10 MG tablet Take 10 mg by mouth at bedtime.   Yes [provider]  pantoprazole (PROTONIX) 40 MG tablet Take 40 mg by mouth 2 (two) times daily. 06/13/20  Yes [provider]  primidone (MYSOLINE) 50 MG tablet Take by mouth. 08/17/16  Yes [provider]  promethazine (PHENERGAN) 12.5 MG tablet Take by mouth. 08/08/16  Yes [provider]  propranolol (INDERAL) 40 MG tablet Take 40 mg by mouth 2 (two) times daily. 06/13/20  Yes [provider]  QUEtiapine (SEROQUEL) 25 MG tablet Take 12.5-25 mg  by mouth at bedtime. 05/18/20  Yes [provider]  sucralfate (CARAFATE) 1 g tablet Take 1 g by mouth 4 (four) times daily. 03/04/20  Yes [provider]  venlafaxine (EFFEXOR) 75 MG tablet Take 75 mg by mouth 2 (two) times daily with a meal.   Yes [provider]  diclofenac Sodium (VOLTAREN) 1 % GEL Voltaren 1 % topical gel 01/22/20   [provider]  levothyroxine (SYNTHROID, LEVOTHROID) 100 MCG tablet Take 125 mcg by mouth daily before breakfast.     [provider]  metFORMIN (GLUCOPHAGE) 500 MG tablet Take 1 pill daily twice daily for diabetes 05/21/19 05/20/20  [provider]  nicotine polacrilex (COMMIT) 2 MG lozenge Place inside cheek. 01/22/20   [provider]  ondansetron (ZOFRAN ODT) 8 MG disintegrating tablet Take 1 tablet (8 mg total) by mouth every 8 (eight) hours as needed. 04/29/20   Norval Gable, MD  SPIRIVA HANDIHALER 18 MCG inhalation capsule INHALE 1 CAPSULE VIA HANDIHALER ONCE DAILY AT THE SAME TIME EVERY DAY 05/29/18   [provider]  hydrochlorothiazide (HYDRODIURIL) 25 MG tablet Take 1 tablet by mouth daily. 03/15/21 04/03/21  [provider]  losartan (COZAAR) 100 MG tablet Take 1 tablet by mouth daily. 03/15/21 04/03/21  [provider]  omeprazole (PRILOSEC) 40 MG capsule Take 40 mg by mouth daily.  10/20/19  [provider]  pravastatin (PRAVACHOL) 10 MG tablet Take 10 mg by mouth daily.  04/21/20  [provider]    Family History Family History  Problem Relation Age of Onset  . Hypertension Mother   . Thyroid disease Mother   . Other Father 46       murdered    Social History Social History   Tobacco Use  . Smoking status: Current Some Day Smoker    Packs/day: 0.50    Types: Cigarettes  . Smokeless tobacco: Never Used  Vaping Use  . Vaping Use: Never used  Substance Use Topics  . Alcohol use: No  . Drug use: No     Allergies   Other, Sulfa antibiotics,  Albuterol, Amoxicillin, Gabapentin, and Morphine and related   Review of Systems Review of Systems Per HPI  Physical Exam Triage Vital Signs ED Triage Vitals  Enc Vitals Group     BP 04/03/21 1759 (!) 115/97     Pulse Rate 04/03/21 1759 74     Resp 04/03/21 1759 18     Temp 04/03/21 1759 99.2 F (37.3 C)     Temp Source 04/03/21 1759 Oral     SpO2 04/03/21 1759 95 %     Weight 04/03/21 1752 171 lb (77.6 kg)     Height 04/03/21 1752 5\' 8"  (1.727 m)     Head Circumference --      Peak Flow --      Pain Score 04/03/21 1748 9  Pain Loc --      Pain Edu? --      Excl. in [place]? --    Updated Vital Signs BP (!) 115/97 (BP Location: Left Arm)   Pulse 74   Temp 99.2 F (37.3 C) (Oral)   Resp 18   Ht 5\' 8"  (1.727 m)   Wt 77.6 kg   SpO2 95%   BMI 26.00 kg/m   Visual Acuity Right Eye Distance:   Left Eye Distance:   Bilateral Distance:    Right Eye Near:   Left Eye Near:    Bilateral Near:     Physical Exam Constitutional:      General: She is not in acute distress.    Appearance: She is not ill-appearing.  HENT:     Head: Normocephalic and atraumatic.  Eyes:     General:        Right eye: No discharge.        Left eye: No discharge.     Conjunctiva/sclera: Conjunctivae normal.  Cardiovascular:     Rate and Rhythm: Normal rate and regular rhythm.  Pulmonary:     Effort: Pulmonary effort is normal. No respiratory distress.     Breath sounds: No wheezing or rales.  Neurological:     Mental Status: She is alert.  Psychiatric:        Mood and Affect: Mood normal.        Behavior: Behavior normal.    UC Treatments / Results  Labs (all labs ordered are listed, but only abnormal results are displayed) Labs Reviewed  RESP PANEL BY RT-PCR (FLU A&B, COVID) [id number]    EKG   Radiology DG Chest 2 View  Result Date: 04/03/2021 CLINICAL DATA:  Cough and fever. EXAM: CHEST - 2 VIEW COMPARISON:  10/24/2020 FINDINGS: The heart size and mediastinal contours are  within normal limits. Airspace opacity within the lingula or right middle lobe is noted on the lateral radiograph. Unchanged biapical pleuroparenchymal scarring. The visualized skeletal structures are unremarkable. IMPRESSION: Lingular or right middle lobe airspace opacity compatible with pneumonia. Electronically Signed   By: Kerby Moors M.D.   On: 04/03/2021 19:43    Procedures Procedures (including critical care time)  Medications Ordered in UC Medications - No data to display  Initial Impression / Assessment and Plan / UC Course  I have reviewed the triage vital signs and the nursing notes.  Pertinent labs & imaging results that were available during my care of the patient were reviewed by me and considered in my medical decision making (see chart for details).    70 year old female presents with respiratory symptoms and fever.  This is an acute illness with systemic symptoms (fever). Patient has COPD and is experiencing and exacerbation. Chest xray revealed R middle lobe pneumonia. Treating with Levaquin and Bromfed. Offered prednisone and patient declined.   Final Clinical Impressions(s) / UC Diagnoses   Final diagnoses:  COPD exacerbation ([place])  Community acquired pneumonia of right middle lobe of lung     Discharge Instructions     Medication as prescribed.  Take care  Dr. Lacinda Axon    ED Prescriptions    Medication Sig Dispense Auth. Provider   brompheniramine-pseudoephedrine-DM 30-2-10 MG/5ML syrup Take 5 mLs by mouth 4 (four) times daily as needed. 120 mL Taylen Osorto G, DO   levofloxacin (LEVAQUIN) 750 MG tablet Take 1 tablet (750 mg total) by mouth daily. 7 tablet Coral Spikes, DO     PDMP not reviewed  this encounter.   Coral Spikes, [place] 04/03/21 2304

## 2021-04-03 NOTE — Discharge Instructions (Signed)
Medication as prescribed.  Take care  Dr. Rawan Riendeau  

## 2021-04-05 ENCOUNTER — Telehealth: Payer: Self-pay | Admitting: Family Medicine

## 2021-04-05 MED ORDER — HYDROCOD POLST-CPM POLST ER 10-8 MG/5ML PO SUER: 5.0000 mL | Freq: Two times a day (BID) | ORAL | 0 refills | Status: DC | PRN

## 2021-04-05 NOTE — Telephone Encounter (Signed)
Patient requesting additional cough medication.  Rx sent.  [facility]

## 2021-10-19 ENCOUNTER — Ambulatory Visit
Admission: EM | Admit: 2021-10-19 | Discharge: 2021-10-19 | Disposition: A | Discharge status: Home / Self Care | Payer: Medicare Other | Attending: Internal Medicine | Admitting: Internal Medicine

## 2021-10-19 DIAGNOSIS — J40 Bronchitis, not specified as acute or chronic: Secondary | ICD-10-CM | POA: Diagnosis not present

## 2021-10-19 MED ORDER — PSEUDOEPH-BROMPHEN-DM 30-2-10 MG/5ML PO SYRP: 5.0000 mL | ORAL_SOLUTION | Freq: Four times a day (QID) | ORAL | 0 refills | Status: DC | PRN

## 2021-10-19 MED ORDER — ALBUTEROL SULFATE HFA 108 (90 BASE) MCG/ACT IN AERS: 1.0000 | INHALATION_SPRAY | Freq: Four times a day (QID) | RESPIRATORY_TRACT | 0 refills | Status: AC | PRN

## 2021-10-19 MED ORDER — DOXYCYCLINE HYCLATE 100 MG PO CAPS
100.0000 mg | ORAL_CAPSULE | Freq: Two times a day (BID) | ORAL | 0 refills | Status: DC
Start: 2021-10-19 — End: 2022-09-20

## 2021-10-19 MED ORDER — ONDANSETRON 4 MG PO TBDP: ORAL_TABLET | ORAL | 0 refills | Status: AC

## 2021-10-19 NOTE — ED Provider Notes (Signed)
[facility]    CSN: [id number] Arrival date & time: 10/19/21  1621      History   Chief Complaint Chief Complaint  Patient presents with   Cough    HPI Debbie Hendricks is a 70 y.o. female who presents with cough and wheezing x 1 week. Her cough is productive on occasion with green mucous. She is out of her inhaler. Denies fever, chills or sweats. She is still a smoker.     Past Medical History:  Diagnosis Date   Anxiety    Cancer ([place])    Diabetes mellitus without complication ([place])    Femoral nerve injury    Hearing loss    right side   Hyperlipidemia    Hypertension    OSA (obstructive sleep apnea)    Thyroid disease     Patient Active Problem List   Diagnosis Date Noted   OSA (obstructive sleep apnea)    Other insomnia 08/03/2020   Nocturnal hypoxia 08/03/2020   Other hypersomnia 08/03/2020   GERD (gastroesophageal reflux disease) 08/01/2020   HTN (hypertension) 08/01/2020    Past Surgical History:  Procedure Laterality Date   ABDOMINAL HYSTERECTOMY     ACOUSTIC NEUROMA RESECTION     BLADDER SURGERY     CARDIAC CATHETERIZATION     ECTOPIC PREGNANCY SURGERY      OB History   No obstetric history on file.      Home Medications    Prior to Admission medications   Medication Sig Start Date End Date Taking? Authorizing Provider  brompheniramine-pseudoephedrine-DM (BROMFED DM) 30-2-10 MG/5ML syrup Take 5 mLs by mouth 4 (four) times daily as needed. 10/19/21  Yes Rodriguez-Southworth, Sunday Spillers, PA-C  doxycycline (VIBRAMYCIN) 100 MG capsule Take 1 capsule (100 mg total) by mouth 2 (two) times daily. 10/19/21  Yes Rodriguez-Southworth, Sunday Spillers, PA-C  ondansetron (ZOFRAN-ODT) 4 MG disintegrating tablet 1-2 q 8h prn nausea 10/19/21  Yes Rodriguez-Southworth, Sunday Spillers, PA-C  albuterol (PROAIR HFA) 108 (90 Base) MCG/ACT inhaler Inhale 1-2 puffs into the lungs every 6 (six) hours as needed for wheezing or shortness of breath. 10/19/21   Rodriguez-Southworth,  Sunday Spillers, PA-C  amLODipine (NORVASC) 5 MG tablet Take 5 mg by mouth daily.    [provider]  aspirin 81 MG chewable tablet Chew by mouth daily.    [provider]  atorvastatin (LIPITOR) 20 MG tablet Take 20 mg by mouth daily.    [provider]  clonazePAM (KLONOPIN) 1 MG tablet Take 1 mg by mouth 3 (three) times daily as needed for anxiety.    [provider]  cyclobenzaprine (FLEXERIL) 5 MG tablet Take by mouth. 07/13/20   [provider]  diclofenac Sodium (VOLTAREN) 1 % GEL Voltaren 1 % topical gel 01/22/20   [provider]  fexofenadine (ALLEGRA) 180 MG tablet Take 1 tablet by mouth daily. 03/11/20 04/03/21  [provider]  fluticasone (FLONASE) 50 MCG/ACT nasal spray Place into both nostrils daily.    [provider]  ipratropium (ATROVENT) 0.03 % nasal spray 2 sprays by Each Nare route Three (3) times a day. 01/22/20 04/03/21  [provider]  levothyroxine (SYNTHROID, LEVOTHROID) 100 MCG tablet Take 125 mcg by mouth daily before breakfast.     [provider]  losartan-hydrochlorothiazide (HYZAAR) 100-25 MG tablet Take 1 tablet by mouth daily.    [provider]  meclizine (ANTIVERT) 25 MG tablet Take 1 tablet (25 mg total) by mouth 3 (three) times daily as needed for dizziness. 05/14/17  Harvest Dark, MD  metFORMIN (GLUCOPHAGE) 500 MG tablet Take 1 pill daily twice daily for diabetes 05/21/19 05/20/20  [provider]  montelukast (SINGULAIR) 10 MG tablet Take 10 mg by mouth at bedtime.    [provider]  nicotine polacrilex (COMMIT) 2 MG lozenge Place inside cheek. 01/22/20   [provider]  pantoprazole (PROTONIX) 40 MG tablet Take 40 mg by mouth 2 (two) times daily. 06/13/20   [provider]  primidone (MYSOLINE) 50 MG tablet Take by mouth. 08/17/16   [provider]  promethazine (PHENERGAN) 12.5 MG tablet Take by mouth. 08/08/16   [provider]  propranolol (INDERAL) 40 MG tablet Take 40 mg by mouth 2 (two) times daily. 06/13/20   [provider]  QUEtiapine (SEROQUEL) 25 MG tablet Take 12.5-25 mg by mouth at bedtime. 05/18/20   [provider]  SPIRIVA HANDIHALER 18 MCG inhalation capsule INHALE 1 CAPSULE VIA HANDIHALER ONCE DAILY AT THE SAME TIME EVERY DAY 05/29/18   [provider]  sucralfate (CARAFATE) 1 g tablet Take 1 g by mouth 4 (four) times daily. 03/04/20   [provider]  venlafaxine (EFFEXOR) 75 MG tablet Take 75 mg by mouth 2 (two) times daily with a meal.    [provider]  hydrochlorothiazide (HYDRODIURIL) 25 MG tablet Take 1 tablet by mouth daily. 03/15/21 04/03/21  [provider]  losartan (COZAAR) 100 MG tablet Take 1 tablet by mouth daily. 03/15/21 04/03/21  [provider]  omeprazole (PRILOSEC) 40 MG capsule Take 40 mg by mouth daily.  10/20/19  [provider]  pravastatin (PRAVACHOL) 10 MG tablet Take 10 mg by mouth daily.  04/21/20  [provider]    Family History Family History  Problem Relation Age of Onset   Hypertension Mother    Thyroid disease Mother    Other Father 70       murdered    Social History Social History   Tobacco Use   Smoking status: Some Days    Packs/day: 0.50    Types: Cigarettes   Smokeless tobacco: Never  Vaping Use   Vaping Use: Never used  Substance Use Topics   Alcohol use: No   Drug use: No     Allergies   Other, Sulfa antibiotics, Albuterol, Amoxicillin, Gabapentin, and Morphine and related   Review of Systems Review of Systems  Constitutional:  Negative for chills, diaphoresis, fatigue and fever.  HENT:  Positive for voice change. Negative for congestion, postnasal drip, rhinorrhea, sore throat and trouble swallowing.   Respiratory:  Positive for cough and wheezing. Negative for chest tightness and shortness of breath.        Productive with dark green mucous, but  only on occasion   Gastrointestinal:  Positive for nausea. Negative for diarrhea and vomiting.       [place] provoked with cough  Musculoskeletal:  Negative for myalgias.  Skin:  Negative for rash.  Neurological:  Negative for headaches.    Physical Exam Triage Vital Signs ED Triage Vitals  Enc Vitals Group     BP 10/19/21 1645 (!) 141/83     Pulse Rate 10/19/21 1645 93     Resp 10/19/21 1645 16     Temp 10/19/21 1645 99 F (37.2 C)     Temp Source 10/19/21 1645 Oral     SpO2 10/19/21 1645 96 %     Weight 10/19/21 1640 171 lb 1.2 oz (77.6 kg)     Height 10/19/21  1640 5\' 8"  (1.727 m)     Head Circumference --      Peak Flow --      Pain Score --      Pain Loc --      Pain Edu? --      Excl. in [place]? --    No data found.  Updated Vital Signs BP (!) 141/83 (BP Location: Left Arm)   Pulse 93   Temp 99 F (37.2 C) (Oral)   Resp 16   Ht 5\' 8"  (1.727 m)   Wt 171 lb 1.2 oz (77.6 kg)   SpO2 96%   BMI 26.01 kg/m   Visual Acuity Right Eye Distance:   Left Eye Distance:   Bilateral Distance:    Right Eye Near:   Left Eye Near:    Bilateral Near:     Physical Exam Physical Exam Constitutional:      General: He is not in acute distress.    Appearance: He is not toxic-appearing.  HENT:     Head: Normocephalic.     Right Ear: Tympanic membrane, ear canal and external ear normal.     Left Ear: Ear canal and external ear normal.     Nose: Nose normal.     Mouth/Throat:     Mouth: Mucous membranes are moist.     Pharynx: Oropharynx is clear.  Eyes:     General: No scleral icterus.    Conjunctiva/sclera: Conjunctivae normal.  Cardiovascular:     Rate and Rhythm: Normal rate and regular rhythm.     Heart sounds: No murmur heard.   Pulmonary:     Effort: Pulmonary effort is normal. No respiratory distress.     Breath sounds: Wheezing present.     Comments: Has auditory wheezing Musculoskeletal:        General: Normal range of motion.     Cervical back: Neck supple.   Lymphadenopathy:     Cervical: No cervical adenopathy.  Skin:    General: Skin is warm and dry.     Findings: No rash.  Neurological:     Mental Status: He is alert and oriented to person, place, and time.     Gait: Gait normal.  Psychiatric:        Mood and Affect: Mood normal.        Behavior: Behavior normal.        Thought Content: Thought content normal.        Judgment: Judgment normal.    UC Treatments / Results  Labs (all labs ordered are listed, but only abnormal results are displayed) Labs Reviewed - No data to display  EKG   Radiology No results found.  Procedures Procedures (including critical care time)  Medications Ordered in UC Medications - No data to display  Initial Impression / Assessment and Plan / UC Course  I have reviewed the triage vital signs and the nursing notes. Acute COPD exacerbation I placed her on Doxy, Bromfed, and Albuterol inhaler as noted.  Final Clinical Impressions(s) / UC Diagnoses   Final diagnoses:  Bronchitis   Discharge Instructions   None    ED Prescriptions     Medication Sig Dispense Auth. Provider   albuterol (PROAIR HFA) 108 (90 Base) MCG/ACT inhaler Inhale 1-2 puffs into the lungs every 6 (six) hours as needed for wheezing or shortness of breath. 1 each Rodriguez-Southworth, Sunday Spillers, PA-C   ondansetron (ZOFRAN-ODT) 4 MG disintegrating tablet 1-2 q 8h prn nausea 20 tablet Rodriguez-Southworth, Sunday Spillers, PA-C  doxycycline (VIBRAMYCIN) 100 MG capsule Take 1 capsule (100 mg total) by mouth 2 (two) times daily. 20 capsule Rodriguez-Southworth, Jocelynne Duquette, PA-C   brompheniramine-pseudoephedrine-DM (BROMFED DM) 30-2-10 MG/5ML syrup Take 5 mLs by mouth 4 (four) times daily as needed. 120 mL Rodriguez-Southworth, Sunday Spillers, PA-C      I have reviewed the PDMP during this encounter.   Shelby Mattocks, Hershal Coria 10/19/21 1806

## 2021-10-19 NOTE — ED Triage Notes (Signed)
Pt c/o cough, wheezing, x 1 week. Lower back pain across right hip sxs for years. Pt uses CPAP machine at night

## 2022-04-13 DIAGNOSIS — N1831 Chronic kidney disease, stage 3a: Secondary | ICD-10-CM | POA: Insufficient documentation

## 2022-04-13 DIAGNOSIS — E7800 Pure hypercholesterolemia, unspecified: Secondary | ICD-10-CM | POA: Insufficient documentation

## 2022-05-11 IMAGING — CR DG CHEST 2V
2 series · 2 of 2 positions shown · non-contrast
Comparison: 10/10/2020

CLINICAL DATA: Cough and sinus pressure

EXAM:
CHEST - 2 VIEW

[chest pa]
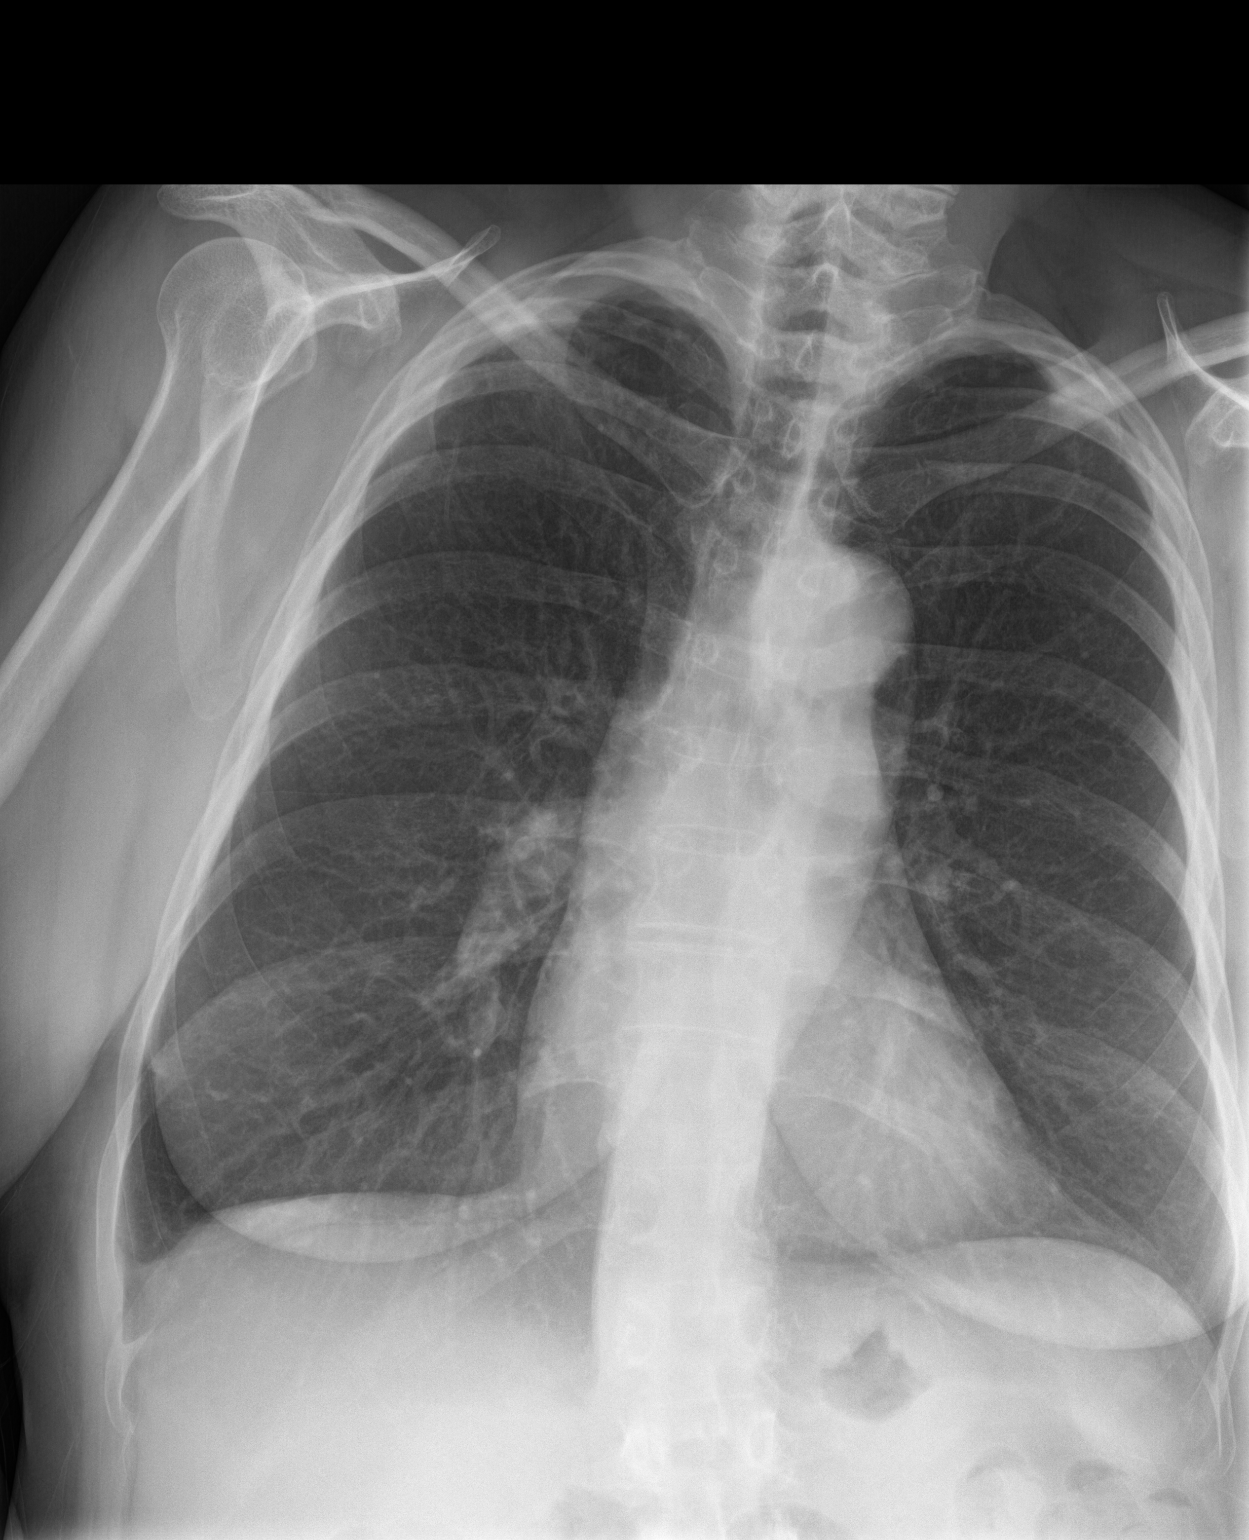

[chest lat]
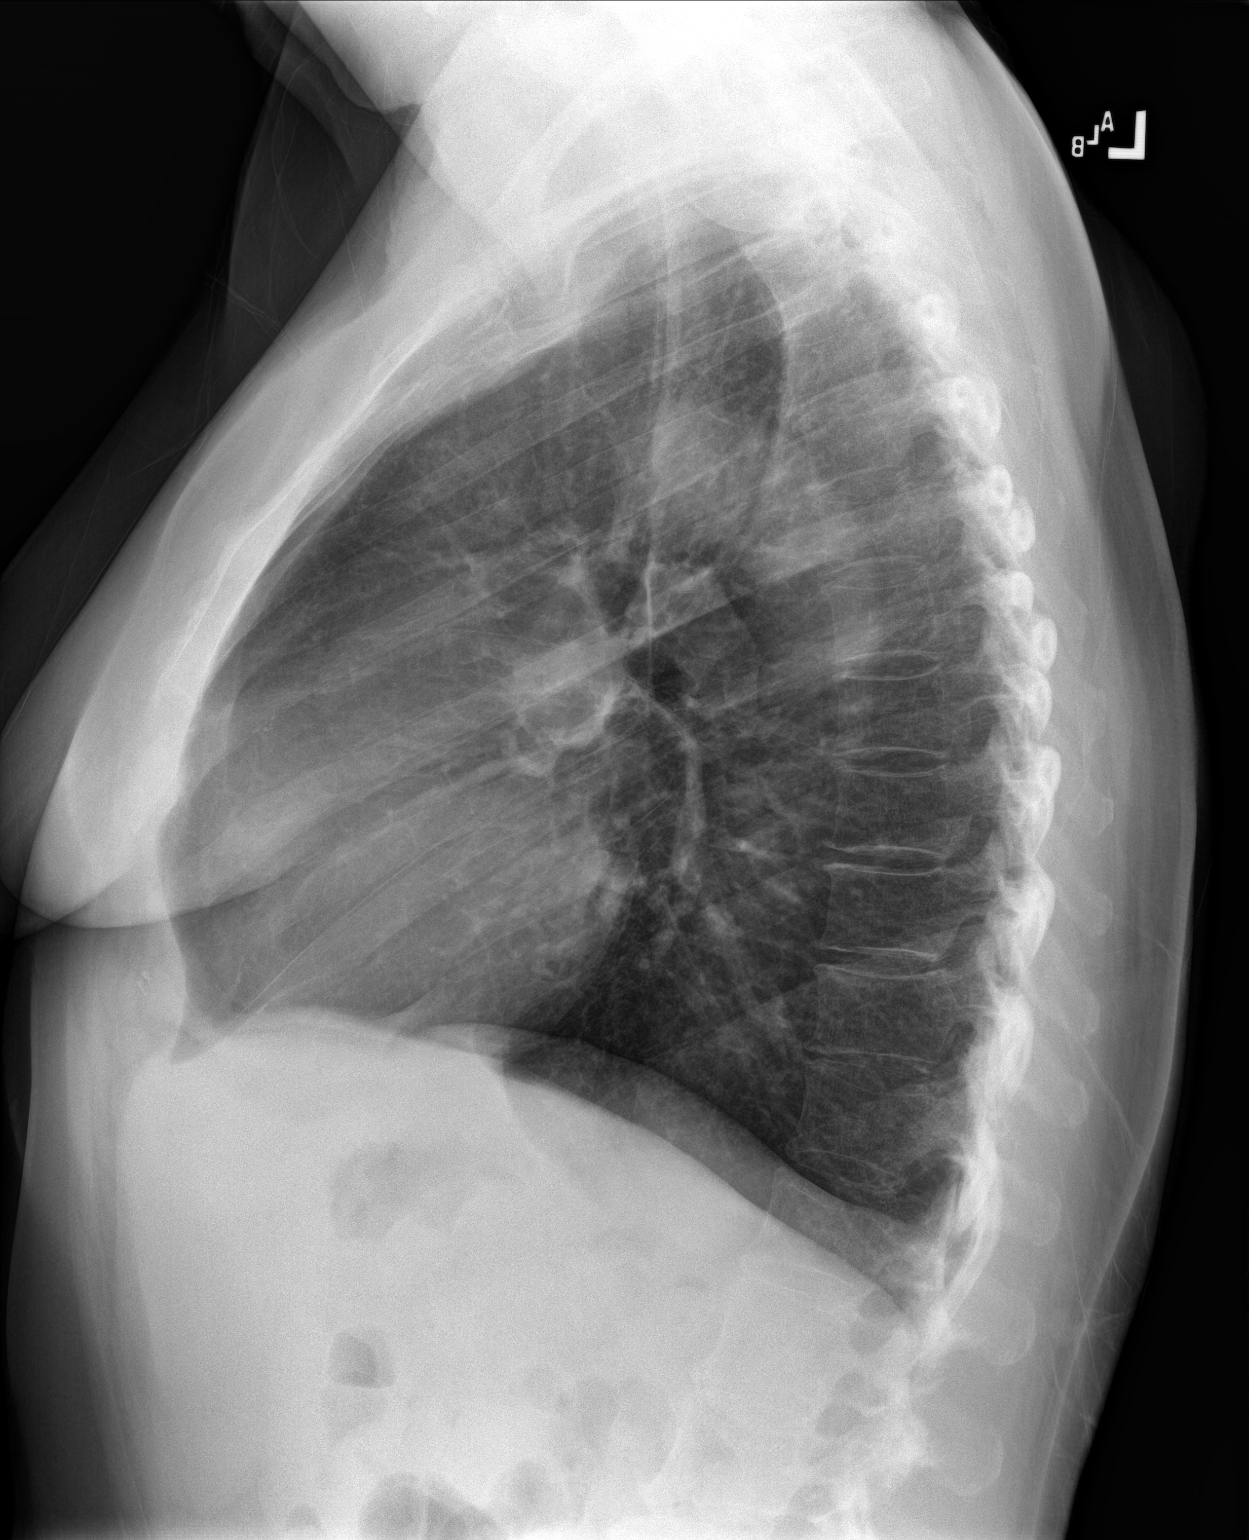

[2 of 2 positions shown; findings below may reference images not displayed]

FINDINGS: The heart size and mediastinal contours are within normal limits.
Both lungs are clear. The visualized skeletal structures are
unremarkable.
IMPRESSION: No active cardiopulmonary disease.

## 2022-09-20 ENCOUNTER — Ambulatory Visit
Admission: EM | Admit: 2022-09-20 | Discharge: 2022-09-20 | Disposition: A | Discharge status: Home / Self Care | Payer: Medicare HMO | Attending: Physician Assistant | Admitting: Physician Assistant

## 2022-09-20 ENCOUNTER — Other Ambulatory Visit: Payer: Self-pay

## 2022-09-20 DIAGNOSIS — R051 Acute cough: Secondary | ICD-10-CM

## 2022-09-20 DIAGNOSIS — J441 Chronic obstructive pulmonary disease with (acute) exacerbation: Secondary | ICD-10-CM | POA: Diagnosis not present

## 2022-09-20 DIAGNOSIS — R062 Wheezing: Secondary | ICD-10-CM

## 2022-09-20 MED ORDER — AZITHROMYCIN 250 MG PO TABS: 250.0000 mg | ORAL_TABLET | Freq: Every day | ORAL | 0 refills | Status: DC

## 2022-09-20 MED ORDER — PSEUDOEPH-BROMPHEN-DM 30-2-10 MG/5ML PO SYRP: 5.0000 mL | ORAL_SOLUTION | Freq: Four times a day (QID) | ORAL | 0 refills | Status: DC | PRN

## 2022-09-20 MED ORDER — PREDNISONE 20 MG PO TABS: 40.0000 mg | ORAL_TABLET | Freq: Every day | ORAL | 0 refills | Status: AC

## 2022-09-20 NOTE — ED Provider Notes (Signed)
[facility]    CSN: [id number] Arrival date & time: 09/20/22  1415      History   Chief Complaint Chief Complaint  Patient presents with   Nasal Congestion    HPI Debbie Hendricks is a 71 y.o. female with history of diet-controlled diabetes, hypertension, hyperlipidemia, obstructive sleep apnea, tobacco abuse and COPD.  Patient presents today for 1 week history of nasal congestion, sinus pressure, cough productive of yellowish sputum, wheezing and shortness of breath.  She says her sinus symptoms have improved but now she feels everything went to her chest.  She denies any associated fever.  She believes she is having exacerbation of her COPD.  She has been taking over-the-counter cough medication without improvement in symptoms.  She has no other concerns.  HPI  Past Medical History:  Diagnosis Date   Anxiety    Cancer ([place])    Diabetes mellitus without complication ([place])    Femoral nerve injury    Hearing loss    right side   Hyperlipidemia    Hypertension    OSA (obstructive sleep apnea)    Thyroid disease     Patient Active Problem List   Diagnosis Date Noted   OSA (obstructive sleep apnea)    Other insomnia 08/03/2020   Nocturnal hypoxia 08/03/2020   Other hypersomnia 08/03/2020   GERD (gastroesophageal reflux disease) 08/01/2020   HTN (hypertension) 08/01/2020    Past Surgical History:  Procedure Laterality Date   ABDOMINAL HYSTERECTOMY     ACOUSTIC NEUROMA RESECTION     BLADDER SURGERY     CARDIAC CATHETERIZATION     ECTOPIC PREGNANCY SURGERY      OB History   No obstetric history on file.      Home Medications    Prior to Admission medications   Medication Sig Start Date End Date Taking? Authorizing Provider  azithromycin (ZITHROMAX) 250 MG tablet Take 1 tablet (250 mg total) by mouth daily. Take first 2 tablets together, then 1 every day until finished. 09/20/22  Yes Danton Clap, PA-C  predniSONE (DELTASONE) 20 MG tablet Take 2  tablets (40 mg total) by mouth daily for 5 days. 09/20/22 09/25/22 Yes Danton Clap, PA-C  albuterol (PROAIR HFA) 108 (90 Base) MCG/ACT inhaler Inhale 1-2 puffs into the lungs every 6 (six) hours as needed for wheezing or shortness of breath. 10/19/21   Rodriguez-Southworth, Sunday Spillers, PA-C  amLODipine (NORVASC) 5 MG tablet Take 5 mg by mouth daily.    [provider]  aspirin 81 MG chewable tablet Chew by mouth daily.    [provider]  atorvastatin (LIPITOR) 20 MG tablet Take 20 mg by mouth daily.    [provider]  brompheniramine-pseudoephedrine-DM (BROMFED DM) 30-2-10 MG/5ML syrup Take 5 mLs by mouth 4 (four) times daily as needed. 09/20/22   Danton Clap, PA-C  clonazePAM (KLONOPIN) 1 MG tablet Take 1 mg by mouth 3 (three) times daily as needed for anxiety.    [provider]  cyclobenzaprine (FLEXERIL) 5 MG tablet Take by mouth. 07/13/20   [provider]  diclofenac Sodium (VOLTAREN) 1 % GEL Voltaren 1 % topical gel 01/22/20   [provider]  fexofenadine (ALLEGRA) 180 MG tablet Take 1 tablet by mouth daily. 03/11/20 04/03/21  [provider]  fluticasone (FLONASE) 50 MCG/ACT nasal spray Place into both nostrils daily.    [provider]  ipratropium (ATROVENT) 0.03 % nasal spray 2 sprays by Each Nare route Three (3) times  a day. 01/22/20 04/03/21  [provider]  levothyroxine (SYNTHROID, LEVOTHROID) 100 MCG tablet Take 125 mcg by mouth daily before breakfast.     [provider]  losartan-hydrochlorothiazide (HYZAAR) 100-25 MG tablet Take 1 tablet by mouth daily.    [provider]  meclizine (ANTIVERT) 25 MG tablet Take 1 tablet (25 mg total) by mouth 3 (three) times daily as needed for dizziness. 05/14/17   Harvest Dark, MD  metFORMIN (GLUCOPHAGE) 500 MG tablet Take 1 pill daily twice daily for diabetes 05/21/19 05/20/20  [provider]  montelukast (SINGULAIR) 10 MG tablet Take 10 mg  by mouth at bedtime.    [provider]  nicotine polacrilex (COMMIT) 2 MG lozenge Place inside cheek. 01/22/20   [provider]  ondansetron (ZOFRAN-ODT) 4 MG disintegrating tablet 1-2 q 8h prn nausea 10/19/21   Rodriguez-Southworth, Sunday Spillers, PA-C  pantoprazole (PROTONIX) 40 MG tablet Take 40 mg by mouth 2 (two) times daily. 06/13/20   [provider]  primidone (MYSOLINE) 50 MG tablet Take by mouth. 08/17/16   [provider]  promethazine (PHENERGAN) 12.5 MG tablet Take by mouth. 08/08/16   [provider]  propranolol (INDERAL) 40 MG tablet Take 40 mg by mouth 2 (two) times daily. 06/13/20   [provider]  QUEtiapine (SEROQUEL) 25 MG tablet Take 12.5-25 mg by mouth at bedtime. 05/18/20   [provider]  SPIRIVA HANDIHALER 18 MCG inhalation capsule INHALE 1 CAPSULE VIA HANDIHALER ONCE DAILY AT THE SAME TIME EVERY DAY 05/29/18   [provider]  sucralfate (CARAFATE) 1 g tablet Take 1 g by mouth 4 (four) times daily. 03/04/20   [provider]  venlafaxine (EFFEXOR) 75 MG tablet Take 75 mg by mouth 2 (two) times daily with a meal.    [provider]  hydrochlorothiazide (HYDRODIURIL) 25 MG tablet Take 1 tablet by mouth daily. 03/15/21 04/03/21  [provider]  losartan (COZAAR) 100 MG tablet Take 1 tablet by mouth daily. 03/15/21 04/03/21  [provider]  omeprazole (PRILOSEC) 40 MG capsule Take 40 mg by mouth daily.  10/20/19  [provider]  pravastatin (PRAVACHOL) 10 MG tablet Take 10 mg by mouth daily.  04/21/20  [provider]    Family History Family History  Problem Relation Age of Onset   Hypertension Mother    Thyroid disease Mother    Other Father 64       murdered    Social History Social History   Tobacco Use   Smoking status: Some Days    Packs/day: 0.50    Types: Cigarettes   Smokeless tobacco: Never  Vaping Use   Vaping Use: Never used  Substance Use  Topics   Alcohol use: No   Drug use: No     Allergies   Other, Sulfa antibiotics, Albuterol, Amoxicillin, Gabapentin, Morphine and related, and Risperidone   Review of Systems Review of Systems  Constitutional:  Positive for fatigue. Negative for chills, diaphoresis and fever.  HENT:  Positive for congestion, rhinorrhea and sinus pressure. Negative for ear pain, sinus pain and sore throat.   Respiratory:  Positive for cough, shortness of breath and wheezing.   Gastrointestinal:  Negative for abdominal pain, nausea and vomiting.  Musculoskeletal:  Negative for arthralgias and myalgias.  Skin:  Negative for rash.  Neurological:  Negative for weakness and headaches.  Hematological:  Negative for adenopathy.     Physical Exam Triage Vital Signs ED Triage Vitals  Enc Vitals Group  BP 09/20/22 1514 (!) 129/91     Pulse Rate 09/20/22 1514 89     Resp 09/20/22 1514 19     Temp 09/20/22 1514 98.1 F (36.7 C)     Temp Source 09/20/22 1514 Oral     SpO2 09/20/22 1514 96 %     Weight 09/20/22 1517 170 lb (77.1 kg)     Height 09/20/22 1517 '5\' 8"'$  (1.727 m)     Head Circumference --      Peak Flow --      Pain Score 09/20/22 1516 5     Pain Loc --      Pain Edu? --      Excl. in [place]? --    No data found.  Updated Vital Signs BP (!) 129/91 (BP Location: Left Arm)   Pulse 89   Temp 98.1 F (36.7 C) (Oral)   Resp 19   Ht '5\' 8"'$  (1.727 m)   Wt 170 lb (77.1 kg)   SpO2 96%   BMI 25.85 kg/m   Physical Exam Vitals and nursing note reviewed.  Constitutional:      General: She is not in acute distress.    Appearance: Normal appearance. She is not ill-appearing or toxic-appearing.  HENT:     Head: Normocephalic and atraumatic.     Right Ear: A middle ear effusion is present.     Left Ear: A middle ear effusion is present.     Nose: Congestion present.     Mouth/Throat:     Mouth: Mucous membranes are moist.     Pharynx: Oropharynx is clear.  Eyes:     General: No scleral  icterus.       Right eye: No discharge.        Left eye: No discharge.     Conjunctiva/sclera: Conjunctivae normal.  Cardiovascular:     Rate and Rhythm: Normal rate and regular rhythm.     Heart sounds: Normal heart sounds.  Pulmonary:     Effort: Pulmonary effort is normal. No respiratory distress.     Breath sounds: Wheezing present.  Musculoskeletal:     Cervical back: Neck supple.  Skin:    General: Skin is dry.  Neurological:     General: No focal deficit present.     Mental Status: She is alert. Mental status is at baseline.     Motor: No weakness.     Gait: Gait normal.  Psychiatric:        Mood and Affect: Mood normal.        Behavior: Behavior normal.        Thought Content: Thought content normal.      UC Treatments / Results  Labs (all labs ordered are listed, but only abnormal results are displayed) Labs Reviewed - No data to display  EKG   Radiology No results found.  Procedures Procedures (including critical care time)  Medications Ordered in UC Medications - No data to display  Initial Impression / Assessment and Plan / UC Course  I have reviewed the triage vital signs and the nursing notes.  Pertinent labs & imaging results that were available during my care of the patient were reviewed by me and considered in my medical decision making (see chart for details).   71 year old female with history of COPD, hypertension, hyperlipidemia and diet-controlled diabetes presents for cough and congestion x1 week.  Reports symptoms have recently worsened.  She reports wheezing and shortness of breath and feels that they congestion  has moved into her chest.  She is currently afebrile.  On exam she does have nasal congestion which is minimal.  Throat is clear.  Clear effusion of bilateral TMs.  Few scattered wheezes throughout all lung fields.  No respiratory distress.  Suspect that she is having exacerbation of her COPD.  Sent Bromfed-DM and azithromycin.  She  reports that she does not think she tolerated doxycycline well last time and at 1 point someone gave her a medication that started with an "L" that sent her to the hospital.  I suppose this may be Levaquin.  Advised plenty of rest and fluids and to return for reexamination and chest x-ray if fever, worsening cough or breathing difficulty.  I printed a prescription for prednisone in case the other medications are not helping the next couple days.  She says she does not want to take prednisone unless she has to.  Advised her to continue using her breathing treatments.  Follow-up here as needed.  Final Clinical Impressions(s) / UC Diagnoses   Final diagnoses:  COPD exacerbation ([place])  Acute cough  Wheezing     Discharge Instructions      -Exacerbation of your COPD.  Return if you develop a fever, worsening cough or increased breathing difficulty.     ED Prescriptions     Medication Sig Dispense Auth. Provider   brompheniramine-pseudoephedrine-DM (BROMFED DM) 30-2-10 MG/5ML syrup Take 5 mLs by mouth 4 (four) times daily as needed. 120 mL Laurene Footman B, PA-C   azithromycin (ZITHROMAX) 250 MG tablet Take 1 tablet (250 mg total) by mouth daily. Take first 2 tablets together, then 1 every day until finished. 6 tablet Laurene Footman B, PA-C   predniSONE (DELTASONE) 20 MG tablet Take 2 tablets (40 mg total) by mouth daily for 5 days. 10 tablet Gretta Cool      PDMP not reviewed this encounter.   Danton Clap, PA-C 09/20/22 [phone]

## 2022-09-20 NOTE — ED Triage Notes (Addendum)
Pt states she has been feeling bad since attending grandson's football game over a week ago. Head feels "like concrete" and sinuses are bothering her. Endorses cough and congestion productive of green sputum

## 2022-09-20 NOTE — Discharge Instructions (Addendum)
-  Exacerbation of your COPD.  Return if you develop a fever, worsening cough or increased breathing difficulty.

## 2022-09-23 ENCOUNTER — Encounter: Payer: Self-pay | Admitting: Emergency Medicine

## 2022-09-23 ENCOUNTER — Ambulatory Visit
Admission: EM | Admit: 2022-09-23 | Discharge: 2022-09-23 | Disposition: A | Discharge status: Home / Self Care | Payer: Medicare HMO | Attending: Emergency Medicine | Admitting: Emergency Medicine

## 2022-09-23 DIAGNOSIS — J069 Acute upper respiratory infection, unspecified: Secondary | ICD-10-CM

## 2022-09-23 MED ORDER — PROMETHAZINE-DM 6.25-15 MG/5ML PO SYRP: 5.0000 mL | ORAL_SOLUTION | Freq: Four times a day (QID) | ORAL | 0 refills | Status: DC | PRN

## 2022-09-23 MED ORDER — IPRATROPIUM BROMIDE 0.03 % NA SOLN: 2.0000 | Freq: Four times a day (QID) | NASAL | 0 refills | Status: AC

## 2022-09-23 MED ORDER — METHYLPREDNISOLONE 4 MG PO TBPK: ORAL_TABLET | ORAL | 0 refills | Status: DC

## 2022-09-23 NOTE — Discharge Instructions (Signed)
Use the Atrovent nasal spray, 2 squirts in each nostril every 6 hours, as needed for runny nose and postnasal drip.  Starting tomorrow take the Medrol dose pack as directed to help decrease the inflammation in your sinuses and chest.  Use the Promethazine DM cough syrup at bedtime for cough and congestion.  It will make you drowsy so do not take it during the day.  Continue to use the Albuterol inhaler as needed for shortness of breath and wheezing  Return for reevaluation or see your primary care provider for any new or worsening symptoms.

## 2022-09-23 NOTE — ED Triage Notes (Signed)
Patient was seen here on 09/20/22 for cough and sinus congestion.  Patient states that she is not feeling better.  Patient states that she was unable to get her cough medicine at her pharmacy.  Patient reports sinus congestion and pressure on the right side of her head and face.  Patient states that she finished her Z-Pack.

## 2022-09-23 NOTE — ED Provider Notes (Signed)
[facility]    CSN: [id number] Arrival date & time: 09/23/22  1331      History   Chief Complaint Chief Complaint  Patient presents with   Sinus Problem    HPI Debbie Hendricks is a 71 y.o. female.   HPI  71 year old female here for evaluation of respiratory complaints.  Patient was seen on 09/20/22 and was diagnosed with a COPD exacerbation and discharged home on albuterol, Bromfed-DM, azithromycin, and prednisone.  She reports that she was unable to pick up her cough syrup from the pharmacy as they were out and she did not fill the prednisone because she has a history of anxiety and typically it causes her to be tremulous.  Past Medical History:  Diagnosis Date   Anxiety    Cancer ([place])    Diabetes mellitus without complication ([place])    Femoral nerve injury    Hearing loss    right side   Hyperlipidemia    Hypertension    OSA (obstructive sleep apnea)    Thyroid disease     Patient Active Problem List   Diagnosis Date Noted   OSA (obstructive sleep apnea)    Other insomnia 08/03/2020   Nocturnal hypoxia 08/03/2020   Other hypersomnia 08/03/2020   GERD (gastroesophageal reflux disease) 08/01/2020   HTN (hypertension) 08/01/2020    Past Surgical History:  Procedure Laterality Date   ABDOMINAL HYSTERECTOMY     ACOUSTIC NEUROMA RESECTION     BLADDER SURGERY     CARDIAC CATHETERIZATION     ECTOPIC PREGNANCY SURGERY      OB History   No obstetric history on file.      Home Medications    Prior to Admission medications   Medication Sig Start Date End Date Taking? Authorizing Provider  methylPREDNISolone (MEDROL DOSEPAK) 4 MG TBPK tablet Take according to the package insert. 09/23/22  Yes Margarette Canada, NP  promethazine-dextromethorphan (PROMETHAZINE-DM) 6.25-15 MG/5ML syrup Take 5 mLs by mouth 4 (four) times daily as needed. 09/23/22  Yes Margarette Canada, NP  albuterol (PROAIR HFA) 108 (90 Base) MCG/ACT inhaler Inhale 1-2 puffs into the lungs every  6 (six) hours as needed for wheezing or shortness of breath. 10/19/21   Rodriguez-Southworth, Sunday Spillers, PA-C  amLODipine (NORVASC) 5 MG tablet Take 5 mg by mouth daily.    [provider]  aspirin 81 MG chewable tablet Chew by mouth daily.    [provider]  atorvastatin (LIPITOR) 20 MG tablet Take 20 mg by mouth daily.    [provider]  clonazePAM (KLONOPIN) 1 MG tablet Take 1 mg by mouth 3 (three) times daily as needed for anxiety.    [provider]  cyclobenzaprine (FLEXERIL) 5 MG tablet Take by mouth. 07/13/20   [provider]  diclofenac Sodium (VOLTAREN) 1 % GEL Voltaren 1 % topical gel 01/22/20   [provider]  fexofenadine (ALLEGRA) 180 MG tablet Take 1 tablet by mouth daily. 03/11/20 04/03/21  [provider]  fluticasone (FLONASE) 50 MCG/ACT nasal spray Place into both nostrils daily.    [provider]  ipratropium (ATROVENT) 0.03 % nasal spray Place 2 sprays into both nostrils 4 (four) times daily. 09/23/22 12/04/23  Margarette Canada, NP  levothyroxine (SYNTHROID, LEVOTHROID) 100 MCG tablet Take 125 mcg by mouth daily before breakfast.     [provider]  losartan-hydrochlorothiazide (HYZAAR) 100-25 MG tablet Take 1 tablet by mouth daily.    [provider]  meclizine (ANTIVERT) 25 MG tablet Take  1 tablet (25 mg total) by mouth 3 (three) times daily as needed for dizziness. 05/14/17   Harvest Dark, MD  metFORMIN (GLUCOPHAGE) 500 MG tablet Take 1 pill daily twice daily for diabetes 05/21/19 05/20/20  [provider]  montelukast (SINGULAIR) 10 MG tablet Take 10 mg by mouth at bedtime.    [provider]  nicotine polacrilex (COMMIT) 2 MG lozenge Place inside cheek. 01/22/20   [provider]  ondansetron (ZOFRAN-ODT) 4 MG disintegrating tablet 1-2 q 8h prn nausea 10/19/21   Rodriguez-Southworth, Sunday Spillers, PA-C  pantoprazole (PROTONIX) 40 MG tablet Take 40 mg by mouth 2 (two) times  daily. 06/13/20   [provider]  predniSONE (DELTASONE) 20 MG tablet Take 2 tablets (40 mg total) by mouth daily for 5 days. 09/20/22 09/25/22  Danton Clap, PA-C  primidone (MYSOLINE) 50 MG tablet Take by mouth. 08/17/16   [provider]  promethazine (PHENERGAN) 12.5 MG tablet Take by mouth. 08/08/16   [provider]  propranolol (INDERAL) 40 MG tablet Take 40 mg by mouth 2 (two) times daily. 06/13/20   [provider]  QUEtiapine (SEROQUEL) 25 MG tablet Take 12.5-25 mg by mouth at bedtime. 05/18/20   [provider]  SPIRIVA HANDIHALER 18 MCG inhalation capsule INHALE 1 CAPSULE VIA HANDIHALER ONCE DAILY AT THE SAME TIME EVERY DAY 05/29/18   [provider]  sucralfate (CARAFATE) 1 g tablet Take 1 g by mouth 4 (four) times daily. 03/04/20   [provider]  venlafaxine (EFFEXOR) 75 MG tablet Take 75 mg by mouth 2 (two) times daily with a meal.    [provider]  hydrochlorothiazide (HYDRODIURIL) 25 MG tablet Take 1 tablet by mouth daily. 03/15/21 04/03/21  [provider]  losartan (COZAAR) 100 MG tablet Take 1 tablet by mouth daily. 03/15/21 04/03/21  [provider]  omeprazole (PRILOSEC) 40 MG capsule Take 40 mg by mouth daily.  10/20/19  [provider]  pravastatin (PRAVACHOL) 10 MG tablet Take 10 mg by mouth daily.  04/21/20  [provider]    Family History Family History  Problem Relation Age of Onset   Hypertension Mother    Thyroid disease Mother    Other Father 27       murdered    Social History Social History   Tobacco Use   Smoking status: Some Days    Packs/day: 0.50    Types: Cigarettes   Smokeless tobacco: Never  Vaping Use   Vaping Use: Never used  Substance Use Topics   Alcohol use: No   Drug use: No     Allergies   Other, Sulfa antibiotics, Albuterol, Amoxicillin, Gabapentin, Morphine and related, and Risperidone   Review of Systems Review of Systems   Constitutional:  Negative for fever.  HENT:  Positive for congestion, rhinorrhea, sinus pressure and sore throat.        Scratchy throat  Respiratory:  Positive for cough, shortness of breath and wheezing.      Physical Exam Triage Vital Signs ED Triage Vitals  Enc Vitals Group     BP 09/23/22 1353 (!) 136/90     Pulse Rate 09/23/22 1353 75     Resp 09/23/22 1353 14     Temp 09/23/22 1353 98.4 F (36.9 C)     Temp Source 09/23/22 1353 Oral     SpO2 09/23/22 1353 96 %     Weight 09/23/22 1352 169 lb 15.6 oz (77.1 kg)     Height  09/23/22 1352 '5\' 8"'$  (1.727 m)     Head Circumference --      Peak Flow --      Pain Score 09/23/22 1352 6     Pain Loc --      Pain Edu? --      Excl. in [place]? --    No data found.  Updated Vital Signs BP (!) 136/90 (BP Location: Left Arm)   Pulse 75   Temp 98.4 F (36.9 C) (Oral)   Resp 14   Ht '5\' 8"'$  (1.727 m)   Wt 169 lb 15.6 oz (77.1 kg)   SpO2 96%   BMI 25.84 kg/m   Visual Acuity Right Eye Distance:   Left Eye Distance:   Bilateral Distance:    Right Eye Near:   Left Eye Near:    Bilateral Near:     Physical Exam Vitals and nursing note reviewed.  Constitutional:      Appearance: Normal appearance. She is not ill-appearing.  HENT:     Head: Normocephalic and atraumatic.     Right Ear: Tympanic membrane, ear canal and external ear normal. There is no impacted cerumen.     Left Ear: Tympanic membrane, ear canal and external ear normal. There is no impacted cerumen.     Nose: Congestion and rhinorrhea present.     Mouth/Throat:     Mouth: Mucous membranes are moist.     Pharynx: Oropharynx is clear. No oropharyngeal exudate or posterior oropharyngeal erythema.  Cardiovascular:     Rate and Rhythm: Normal rate and regular rhythm.     Pulses: Normal pulses.     Heart sounds: Normal heart sounds. No murmur heard.    No friction rub. No gallop.  Pulmonary:     Effort: Pulmonary effort is normal.     Breath sounds: Wheezing  present. No rhonchi or rales.  Musculoskeletal:     Cervical back: Normal range of motion and neck supple.  Lymphadenopathy:     Cervical: No cervical adenopathy.  Skin:    General: Skin is warm and dry.     Capillary Refill: Capillary refill takes less than 2 seconds.  Neurological:     General: No focal deficit present.     Mental Status: She is alert and oriented to person, place, and time.  Psychiatric:        Mood and Affect: Mood normal.        Behavior: Behavior normal.        Thought Content: Thought content normal.        Judgment: Judgment normal.      UC Treatments / Results  Labs (all labs ordered are listed, but only abnormal results are displayed) Labs Reviewed - No data to display  EKG   Radiology No results found.  Procedures Procedures (including critical care time)  Medications Ordered in UC Medications - No data to display  Initial Impression / Assessment and Plan / UC Course  I have reviewed the triage vital signs and the nursing notes.  Pertinent labs & imaging results that were available during my care of the patient were reviewed by me and considered in my medical decision making (see chart for details).   Patient is nontoxic-appearing 71 year old female here for evaluation Rester complaints outlined HPI above.  She is able speak in full sentences without any dyspnea or tachypnea.  She states that she completed a 5-day Z-Pak already in 3 days but she did not take her prednisone as prescribed  and she says that the pharmacy did not have the Bromfed-DM.  Her exam reveals nasal congestion with clear rhinorrhea.  She does have clear postnasal drip in her posterior oropharynx but no erythema or injection.  Cardiopulmonary exam reveals scattered wheezes but no rales or rhonchi.  I do not feel that she needs another round of antibiotics but I will write her for a Medrol Dosepak since the prednisone makes her tremulous as I think this will help her breathing as  well as her nasal congestion.  I Minna refill her Atrovent nasal spray which she was previously prescribed.  The nasal congestion and right ear for Promethazine DM cough syrup since the pharmacy is out of Bromfed-DM.  She is to continue using her albuterol inhaler as needed for shortness of breath or wheezing.  Any new symptoms she should return for reevaluation.   Final Clinical Impressions(s) / UC Diagnoses   Final diagnoses:  Viral URI with cough     Discharge Instructions      Use the Atrovent nasal spray, 2 squirts in each nostril every 6 hours, as needed for runny nose and postnasal drip.  Starting tomorrow take the Medrol dose pack as directed to help decrease the inflammation in your sinuses and chest.  Use the Promethazine DM cough syrup at bedtime for cough and congestion.  It will make you drowsy so do not take it during the day.  Continue to use the Albuterol inhaler as needed for shortness of breath and wheezing  Return for reevaluation or see your primary care provider for any new or worsening symptoms.      ED Prescriptions     Medication Sig Dispense Auth. Provider   ipratropium (ATROVENT) 0.03 % nasal spray Place 2 sprays into both nostrils 4 (four) times daily. 30 mL Margarette Canada, NP   promethazine-dextromethorphan (PROMETHAZINE-DM) 6.25-15 MG/5ML syrup Take 5 mLs by mouth 4 (four) times daily as needed. 118 mL Margarette Canada, NP   methylPREDNISolone (MEDROL DOSEPAK) 4 MG TBPK tablet Take according to the package insert. 1 each Margarette Canada, NP      PDMP not reviewed this encounter.   Margarette Canada, NP 09/23/22 1427

## 2023-12-06 DIAGNOSIS — I272 Pulmonary hypertension, unspecified: Secondary | ICD-10-CM | POA: Insufficient documentation

## 2024-01-08 ENCOUNTER — Ambulatory Visit
Admission: EM | Admit: 2024-01-08 | Discharge: 2024-01-08 | Disposition: A | Discharge status: Home / Self Care | Payer: Medicare HMO | Attending: Family Medicine | Admitting: Family Medicine

## 2024-01-08 ENCOUNTER — Ambulatory Visit (INDEPENDENT_AMBULATORY_CARE_PROVIDER_SITE_OTHER): Payer: Medicare HMO

## 2024-01-08 DIAGNOSIS — J441 Chronic obstructive pulmonary disease with (acute) exacerbation: Secondary | ICD-10-CM | POA: Diagnosis present

## 2024-01-08 DIAGNOSIS — R0602 Shortness of breath: Secondary | ICD-10-CM | POA: Insufficient documentation

## 2024-01-08 LAB — RESP PANEL BY RT-PCR (RSV, FLU A&B, COVID)  RVPGX2
Influenza A by PCR: NEGATIVE
Influenza B by PCR: NEGATIVE
Resp Syncytial Virus by PCR: NEGATIVE
SARS Coronavirus 2 by RT PCR: NEGATIVE

## 2024-01-08 MED ORDER — DOXYCYCLINE HYCLATE 100 MG PO CAPS: 100.0000 mg | ORAL_CAPSULE | Freq: Two times a day (BID) | ORAL | 0 refills | Status: DC

## 2024-01-08 MED ORDER — IPRATROPIUM-ALBUTEROL 0.5-2.5 (3) MG/3ML IN SOLN
3.0000 mL | Freq: Once | RESPIRATORY_TRACT | Status: AC
  Administered 2024-01-08: 3 mL via RESPIRATORY_TRACT

## 2024-01-08 MED ORDER — PREDNISONE 50 MG PO TABS: 50.0000 mg | ORAL_TABLET | Freq: Every day | ORAL | 0 refills | Status: AC

## 2024-01-08 MED ORDER — ONDANSETRON 8 MG PO TBDP
8.0000 mg | ORAL_TABLET | Freq: Once | ORAL | Status: AC
  Administered 2024-01-08: 8 mg via ORAL

## 2024-01-08 MED ORDER — HYDROCOD POLI-CHLORPHE POLI ER 10-8 MG/5ML PO SUER: 5.0000 mL | Freq: Two times a day (BID) | ORAL | 0 refills | Status: DC | PRN

## 2024-01-08 NOTE — ED Triage Notes (Signed)
 Sx x 3-4 days  Non productive cough fever Nasal congestion-runny nose

## 2024-01-08 NOTE — ED Provider Notes (Signed)
 [facility]    CSN: [id number] Arrival date & time: 01/08/24  1038      History   Chief Complaint No chief complaint on file.   HPI Debbie Hendricks is a 73 y.o. female.   HPI  History obtained from the patient. Guy presents for persistent productive cough that is causing rib and abdominal pain. Sputum is "solid white and stringy."  Has headache with left sided nasal congestion. She has used up 2 boxes of Kleenex. Have had a fever and took something for her fever around 4 AM.  Tmax 101 F. Has been having nausea but no vomiting or diarrhea. She took some Phenergan.   Has COPD and uses albuterol.   She has a CPAP but its broken.     Past Medical History:  Diagnosis Date   Anxiety    Cancer (HCC)    Diabetes mellitus without complication (HCC)    Femoral nerve injury    Hearing loss    right side   Hyperlipidemia    Hypertension    OSA (obstructive sleep apnea)    Thyroid disease     Patient Active Problem List   Diagnosis Date Noted   OSA (obstructive sleep apnea)    Other insomnia 08/03/2020   Nocturnal hypoxia 08/03/2020   Other hypersomnia 08/03/2020   GERD (gastroesophageal reflux disease) 08/01/2020   HTN (hypertension) 08/01/2020    Past Surgical History:  Procedure Laterality Date   ABDOMINAL HYSTERECTOMY     ACOUSTIC NEUROMA RESECTION     BLADDER SURGERY     CARDIAC CATHETERIZATION     ECTOPIC PREGNANCY SURGERY      OB History   No obstetric history on file.      Home Medications    Prior to Admission medications   Medication Sig Start Date End Date Taking? Authorizing Provider  amLODipine (NORVASC) 5 MG tablet Take 5 mg by mouth daily.   Yes [provider]  aspirin 81 MG chewable tablet Chew by mouth daily.   Yes [provider]  atorvastatin (LIPITOR) 20 MG tablet Take 20 mg by mouth daily.   Yes [provider]  chlorpheniramine-HYDROcodone (TUSSIONEX) 10-8 MG/5ML Take 5 mLs by mouth every 12  (twelve) hours as needed. 01/08/24  Yes Warren Kugelman, DO  clonazePAM (KLONOPIN) 1 MG tablet Take 1 mg by mouth 3 (three) times daily as needed for anxiety.   Yes [provider]  levothyroxine (SYNTHROID, LEVOTHROID) 100 MCG tablet Take 125 mcg by mouth daily before breakfast.    Yes [provider]  losartan-hydrochlorothiazide (HYZAAR) 100-25 MG tablet Take 1 tablet by mouth daily.   Yes [provider]  predniSONE (DELTASONE) 50 MG tablet Take 1 tablet (50 mg total) by mouth daily for 5 days. 01/08/24 01/13/24 Yes Pranika Finks, DO  propranolol (INDERAL) 40 MG tablet Take 40 mg by mouth 2 (two) times daily. 06/13/20  Yes [provider]  QUEtiapine (SEROQUEL) 25 MG tablet Take 12.5-25 mg by mouth at bedtime. 05/18/20  Yes [provider]  SPIRIVA HANDIHALER 18 MCG inhalation capsule INHALE 1 CAPSULE VIA HANDIHALER ONCE DAILY AT THE SAME TIME EVERY DAY 05/29/18  Yes [provider]  sucralfate (CARAFATE) 1 g tablet Take 1 g by mouth 4 (four) times daily. 03/04/20  Yes [provider]  venlafaxine (EFFEXOR) 75 MG tablet Take 75 mg by mouth 2 (two) times daily with a meal.   Yes [provider]  albuterol (PROAIR HFA) 108 (90 Base)  MCG/ACT inhaler Inhale 1-2 puffs into the lungs every 6 (six) hours as needed for wheezing or shortness of breath. 10/19/21   Rodriguez-Southworth, Nettie Elm, PA-C  cyclobenzaprine (FLEXERIL) 5 MG tablet Take by mouth. 07/13/20   [provider]  diclofenac Sodium (VOLTAREN) 1 % GEL Voltaren 1 % topical gel 01/22/20   [provider]  fexofenadine (ALLEGRA) 180 MG tablet Take 1 tablet by mouth daily. 03/11/20 04/03/21  [provider]  fluticasone (FLONASE) 50 MCG/ACT nasal spray Place into both nostrils daily.    [provider]  ipratropium (ATROVENT) 0.03 % nasal spray Place 2 sprays into both nostrils 4 (four) times daily. 09/23/22 12/04/23  Becky Augusta, NP  meclizine  (ANTIVERT) 25 MG tablet Take 1 tablet (25 mg total) by mouth 3 (three) times daily as needed for dizziness. 05/14/17   Minna Antis, MD  metFORMIN (GLUCOPHAGE) 500 MG tablet Take 1 pill daily twice daily for diabetes 05/21/19 05/20/20  [provider]  montelukast (SINGULAIR) 10 MG tablet Take 10 mg by mouth at bedtime.    [provider]  nicotine polacrilex (COMMIT) 2 MG lozenge Place inside cheek. 01/22/20   [provider]  ondansetron (ZOFRAN-ODT) 4 MG disintegrating tablet 1-2 q 8h prn nausea 10/19/21   Rodriguez-Southworth, Nettie Elm, PA-C  pantoprazole (PROTONIX) 40 MG tablet Take 40 mg by mouth 2 (two) times daily. 06/13/20   [provider]  primidone (MYSOLINE) 50 MG tablet Take by mouth. 08/17/16   [provider]  promethazine (PHENERGAN) 12.5 MG tablet Take by mouth. 08/08/16   [provider]  hydrochlorothiazide (HYDRODIURIL) 25 MG tablet Take 1 tablet by mouth daily. 03/15/21 04/03/21  [provider]  losartan (COZAAR) 100 MG tablet Take 1 tablet by mouth daily. 03/15/21 04/03/21  [provider]  omeprazole (PRILOSEC) 40 MG capsule Take 40 mg by mouth daily.  10/20/19  [provider]  pravastatin (PRAVACHOL) 10 MG tablet Take 10 mg by mouth daily.  04/21/20  [provider]    Family History Family History  Problem Relation Age of Onset   Hypertension Mother    Thyroid disease Mother    Other Father 40       murdered    Social History Social History   Tobacco Use   Smoking status: Some Days    Current packs/day: 0.50    Types: Cigarettes   Smokeless tobacco: Never  Vaping Use   Vaping status: Never Used  Substance Use Topics   Alcohol use: No   Drug use: No     Allergies   Other, Sulfa antibiotics, Lisinopril, Albuterol, Amoxicillin, Clindamycin, Gabapentin, Hydroxyzine, Morphine and codeine, and Risperidone   Review of Systems Review of Systems: negative unless otherwise stated  in HPI.      Physical Exam Triage Vital Signs ED Triage Vitals  Encounter Vitals Group     BP 01/08/24 1056 135/76     Systolic BP Percentile --      Diastolic BP Percentile --      Pulse Rate 01/08/24 1056 71     Resp 01/08/24 1056 20     Temp 01/08/24 1056 98 F (36.7 C)     Temp Source 01/08/24 1056 Oral     SpO2 01/08/24 1056 90 %     Weight --      Height --      Head Circumference --      Peak Flow --      Pain Score 01/08/24 1055 8  Pain Loc --      Pain Education --      Exclude from Growth Chart --    No data found.  Updated Vital Signs BP 135/76 (BP Location: Right Arm)   Pulse 77   Temp 98 F (36.7 C) (Oral)   Resp 20   SpO2 94% Comment: post neb tx  Visual Acuity Right Eye Distance:   Left Eye Distance:   Bilateral Distance:    Right Eye Near:   Left Eye Near:    Bilateral Near:     Physical Exam GEN:     alert, non-toxic appearing female    HENT:  mucus membranes moist, oropharyngeal without lesions or erythema, no tonsillar hypertrophy or exudates,  clear nasal discharge EYES:   no scleral injection or discharge RESP:  no increased work of breathing, coarse breathe sounds bilaterally, faint expiratory wheezing  CVS:   regular rate and rhythm Skin:   warm and dry    UC Treatments / Results  Labs (all labs ordered are listed, but only abnormal results are displayed) Labs Reviewed  RESP PANEL BY RT-PCR (RSV, FLU A&B, COVID)  [id number]    EKG   Radiology DG Chest 2 View Result Date: 01/08/2024 CLINICAL DATA:  Productive cough and shortness of breath EXAM: CHEST - 2 VIEW COMPARISON:  Chest radiograph 04/03/2021 FINDINGS: Normal cardiac and mediastinal contours. Clear lungs. Biapical pleuroparenchymal thickening. No pleural effusion or pneumothorax. Osseous structures unremarkable. IMPRESSION: No active cardiopulmonary disease. Electronically Signed   By: Annia Belt M.D.   On: 01/08/2024 11:51     Procedures Procedures (including  critical care time)  Medications Ordered in UC Medications  ipratropium-albuterol (DUONEB) 0.5-2.5 (3) MG/3ML nebulizer solution 3 mL (3 mLs Nebulization Given 01/08/24 1115)  ondansetron (ZOFRAN-ODT) disintegrating tablet 8 mg (8 mg Oral Given 01/08/24 1118)    Initial Impression / Assessment and Plan / UC Course  I have reviewed the triage vital signs and the nursing notes.  Pertinent labs & imaging results that were available during my care of the patient were reviewed by me and considered in my medical decision making (see chart for details).       Pt is a 73 y.o. female who has COPD  presents for  shortness of breath with respiratory symptoms for the past 3-4 days.   On chart review, Pt had CT chest on 11/27/23 that showed scattered pulmonary nodules measuring up to 3 mm are unchanged since 2019 and most likely benign with emphysema.  [place] is afebrile here without recent antipyretics. Satting 89-95% on room air. Pt given Duoneb x2. Overall pt is non-toxic appearing, well hydrated, without respiratory distress. Pulmonary exam is remarkable for scattered expiratory wheezing, pausing between words and coarse breathe sounds. Given Duoneb x2.  She has been out of her inhalers.  She doesn't use oxygen at home. COVID, RSV and influenza panel obtained and was negative. Chest xray personally reviewed by me without focal pneumonia, pleural effusion, cardiomegaly or pneumothorax.  Radiologist impression reviewed.  Treat COPD exacerbation with prednisone burst and doxycycline as below. Discussed symptomatic treatment. Typical duration of symptoms discussed. Tussionex for cough and allow pt to rest.   Return and strict ED precautions given and voiced understanding. Discussed MDM, treatment plan and plan for follow-up with patient who agrees with plan.     Final Clinical Impressions(s) / UC Diagnoses   Final diagnoses:  SOB (shortness of breath)  COPD exacerbation [facility])     Discharge  Instructions  Stop by the pharmacy to pick up your prescriptions. Your COVID, influenza and RSV tests are negative. Your chest xray didn't show a pneumonia.  I suspect you are having a COPD exacerbation.   If your shortness of breath gets worse, go to the hospital emergency department.  Be sure to monitor your oxygen levels with a pulse oximeter.      ED Prescriptions     Medication Sig Dispense Auth. Provider   doxycycline (VIBRAMYCIN) 100 MG capsule   Take 1 capsule (100 mg total) by mouth 2 (two) times daily. 10 capsule Quinlynn Cuthbert, DO   predniSONE (DELTASONE) 50 MG tablet Take 1 tablet (50 mg total) by mouth daily for 5 days. 5 tablet Jessah Danser, DO   chlorpheniramine-HYDROcodone (TUSSIONEX) 10-8 MG/5ML Take 5 mLs by mouth every 12 (twelve) hours as needed. 115 mL Tekoa Hamor, Seward Meth, DO      I have reviewed the PDMP during this encounter.   Katha Cabal, DO 01/11/24 1337

## 2024-01-08 NOTE — Discharge Instructions (Addendum)
 Stop by the pharmacy to pick up your prescriptions. Your COVID, influenza and RSV tests are negative. Your chest xray didn't show a pneumonia.  I suspect you are having a COPD exacerbation.   If your shortness of breath gets worse, go to the hospital emergency department.  Be sure to monitor your oxygen levels with a pulse oximeter.

## 2024-07-09 ENCOUNTER — Ambulatory Visit: Payer: Self-pay

## 2024-07-09 NOTE — Telephone Encounter (Signed)
 Patient calling to make new patient appointment. Patient with chronic back pain-recommended to Emergency Department but patient refused at this time  Called Nurse Triage reporting Back Pain.  Symptoms began several months ago.  Interventions attempted: Rest, hydration, or home remedies.  Symptoms are: unchanged.  Triage Disposition: Go to ED Now (Notify PCP)  Patient/caregiver understands and will follow disposition?: No, refuses disposition  Copied from CRM [id number]. Topic: Clinical - Red Word Triage >> Jul 09, 2024  3:17 PM Debby BROCKS wrote: Red Word that prompted transfer to Nurse Triage: New Patient Visit at [facility] with Doctor Hadassah Nett. She wants a real doctor to see her due to her pain and nerve damage on spinal cord. She hasn't slept since 5am of the previous day. Reason for Disposition  [1] SEVERE back pain (e.g., excruciating) AND [2] sudden onset AND [3] age > 60 years  Answer Assessment - Initial Assessment Questions 1. ONSET: When did the pain begin? (e.g., minutes, hours, days)     Chronic back pain 2. LOCATION: Where does it hurt? (upper, mid or lower back)     Middle to low back 3. SEVERITY: How bad is the pain?  (e.g., Scale 1-10; mild, moderate, or severe)     10 4. PATTERN: Is the pain constant? (e.g., yes, no; constant, intermittent)      constant 5. RADIATION: Does the pain shoot into your legs or somewhere else?     Radiates into her legs 6. CAUSE:  What do you think is causing the back pain?      Nerve damage to legs 7. BACK OVERUSE:  Any recent lifting of heavy objects, strenuous work or exercise?     no 8. MEDICINES: What have you taken so far for the pain? (e.g., nothing, acetaminophen , NSAIDS)     Patient doesn't have any prescriptions for pain.  9. NEUROLOGIC SYMPTOMS: Do you have any weakness, numbness, or problems with bowel/bladder control?     no 10. OTHER SYMPTOMS: Do you have any other symptoms? (e.g., fever, abdomen  pain, burning with urination, blood in urine)       no  Protocols used: Back Pain-A-AH

## 2024-09-01 ENCOUNTER — Ambulatory Visit: Admitting: Pediatrics

## 2024-09-08 ENCOUNTER — Ambulatory Visit: Payer: Self-pay

## 2024-09-08 NOTE — Telephone Encounter (Signed)
 FYI Only or Action Required?: FYI only for provider.  Patient was last seen in primary care on N/A, new patient.  Called Nurse Triage reporting Back Pain.  Symptoms began several years ago.  Interventions attempted: OTC medications: ibuprofen, naproxen, Tylenol  and Ice/heat application.  Symptoms are: back pain with pain radiating down right leg gradually worsening.  Triage Disposition: See HCP Within 4 Hours (Or PCP Triage)  Patient/caregiver understands and will follow disposition?: Yes             Copied from CRM #[id number]. Topic: Clinical - Red Word Triage >> Sep 08, 2024  7:53 AM Amber H wrote: [facility] that prompted transfer to Nurse Triage: Patient called and stated she had severe nerve damage in her back, has been up since 6oclock yesterday due to the pain, tried hot/cold therapy and still in pain. Appears she has an appt schd for 09/08/2025 however, she said she was told to come in today. Patient stated she would not be able to make it to her appt today due to her daughter having to work. Reason for Disposition  [1] SEVERE back pain (e.g., excruciating, unable to do any normal activities) AND [2] not improved 2 hours after pain medicine  Answer Assessment - Initial Assessment Questions Patient states she has followed up with her current PCP regarding her back pain and was prescribed Lyrica which she states has not helped with pain (reports this was last week). She states she left a message with her PCP and has not received a call back in 5 days.  1. ONSET: When did the pain begin? (e.g., minutes, hours, days)     2 years, 3 months. She states it is bad every day.  2. LOCATION: Where does it hurt? (upper, mid or lower back)     Lumbar and spinal cord  3. SEVERITY: How bad is the pain?  (e.g., Scale 1-10; mild, moderate, or severe)     10/10, every day.  4. PATTERN: Is the pain constant? (e.g., yes, no; constant, intermittent)      Constant.  5.  RADIATION: Does the pain shoot into your legs or somewhere else?     Sometimes it shoots straight down the right buttocks, other times down the front of the shin.  6. CAUSE:  What do you think is causing the back pain?      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  7. BACK OVERUSE:  Any recent lifting of heavy objects, strenuous work or exercise?     No.  8. MEDICINES: What have you taken so far for the pain? (e.g., nothing, acetaminophen , NSAIDS)     Heating pad and cold pack; Tylenol , ibuprofen, Naproxen. She states she was prescribed Lyrica and she couldn't tolerate it.  9. NEUROLOGIC SYMPTOMS: Do you have any weakness, numbness, or problems with bowel/bladder control?     She states sometimes she has urinary incontinence, with history of bladder surgeries. Worsening but not new onset.  10. OTHER SYMPTOMS: Do you have any other symptoms? (e.g., fever, abdomen pain, burning with urination, blood in urine)       No.  Protocols used: Back Pain-A-AH

## 2024-10-29 ENCOUNTER — Telehealth: Payer: Self-pay | Admitting: Family Medicine

## 2024-10-29 NOTE — Telephone Encounter (Signed)
 Called patient to see if we could get her in for new patient appt on 12/26 instead of 12/31 due to office closing at 12pm that day. May need to be transferred to CAL for scheduling

## 2024-11-13 ENCOUNTER — Ambulatory Visit: Admitting: Nurse Practitioner

## 2024-11-13 ENCOUNTER — Ambulatory Visit: Payer: Self-pay

## 2024-11-13 DIAGNOSIS — J449 Chronic obstructive pulmonary disease, unspecified: Secondary | ICD-10-CM | POA: Insufficient documentation

## 2024-11-13 NOTE — Telephone Encounter (Signed)
 FYI Only or Action Required?: FYI only for provider: appointment scheduled on 4.22.26 new pt appt.  Patient was last seen in primary care on n/a.  Called Nurse Triage reporting Diarrhea.  Symptoms began today.  Interventions attempted: OTC medications: imodium.  Symptoms are: unchanged.  Triage Disposition: See Physician Within 24 Hours  Patient/caregiver understands and will follow disposition?: Yes     Copied from CRM #[id number]. Topic: Clinical - Red Word Triage >> Nov 13, 2024  8:41 AM Avram MATSU wrote: Red Word that prompted transfer to Nurse Triage: constantly having diarrhea, stomach cramps, nausea, feel weak at this time. Reason for Disposition  [1] MODERATE diarrhea (e.g., 4-6 times / day more than normal) AND [2] present > 48 hours (2 days)  Answer Assessment - Initial Assessment Questions Pt calling to reschedule new pt appt due to diarrhea. She states that her son made homemade real chinese food and about 3am began to have abdominal cramping and diarrhea. She states she has taken 3 doses total so far today. She has also had promethazine  for nausea about 4am. She states she didn't want to have an accident on the way to the office. She states she vital signs and bs are good. She has been drinking electrolyte fluids. Care instructions given for the diarrhea including when to go to the ER. Pt states understanding. New pt appt rescheduled.     1. DIARRHEA SEVERITY: How bad is the diarrhea? How many more stools have you had in the past 24 hours than normal?      4-7 2. ONSET: When did the diarrhea begin?      About 3am  3. STOOL DESCRIPTION:  How loose or watery is the diarrhea? What is the stool color? Is there any blood or mucous in the stool?     Watery no blood 4. VOMITING: Are you also vomiting? If Yes, ask: How many times in the past 24 hours?      no 5. ABDOMEN PAIN: Are you having any abdomen pain? If Yes, ask: What does it feel like? (e.g.,  crampy, dull, intermittent, constant)      Cramping that started just prior to the diarrhea 6. ABDOMEN PAIN SEVERITY: If present, ask: How bad is the pain?  (e.g., Scale 1-10; mild, moderate, or severe)     She states none currenly 7. ORAL INTAKE: If vomiting, Have you been able to drink liquids? How much liquids have you had in the past 24 hours?    Drinking electrolyte drinks 8. HYDRATION: Any signs of dehydration? (e.g., dry mouth [not just dry lips], too weak to stand, dizziness, new weight loss) When did you last urinate?     States she is weaker than normal but not too weak to stand 9. OTHER SYMPTOMS: Do you have any other symptoms? (e.g., fever, blood in stool)       denies  Protocols used: Diarrhea-A-AH

## 2024-11-13 NOTE — Progress Notes (Deleted)
" ° °  There were no vitals taken for this visit.   Subjective:    Patient ID: Debbie Hendricks, female    DOB: Nov 07, 1951, 73 y.o.   MRN: [id number]  HPI: Debbie Hendricks is a 73 y.o. female  No chief complaint on file.  Patient presents to clinic to establish care with new PCP.  Introduced to publishing rights manager role and practice setting.  All questions answered.  Discussed provider/patient relationship and expectations.  Patient reports a history of ***. Patient denies a history of: Hypertension, Elevated Cholesterol, Diabetes, Thyroid problems, Depression, Anxiety, Neurological problems, and Abdominal problems.   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   Stroke (HCC) 06/23/2018   Type 2 diabetes mellitus with stage 3a chronic kidney disease, without long-term current use of insulin (HCC) 05/04/2019   Resolved Ambulatory Problems    Diagnosis Date Noted   No Resolved Ambulatory Problems   Past Medical History:  Diagnosis Date   Anxiety    Cancer (HCC)    Diabetes mellitus without complication (HCC)    Femoral nerve injury    Hearing loss    Hyperlipidemia    Hypertension    Thyroid disease    Past Surgical History:  Procedure Laterality Date   ABDOMINAL HYSTERECTOMY     ACOUSTIC NEUROMA RESECTION     BLADDER SURGERY     CARDIAC CATHETERIZATION     ECTOPIC PREGNANCY SURGERY     Family History  Problem Relation Age of  Onset   Hypertension Mother    Thyroid disease Mother    Other Father 23       murdered     Review of Systems  Per HPI unless specifically indicated above     Objective:    There were no vitals taken for this visit.  Wt Readings from Last 3 Encounters:  09/23/22 169 lb 15.6 oz (77.1 kg)  09/20/22 170 lb (77.1 kg)  10/19/21 171 lb 1.2 oz (77.6 kg)    Physical Exam  Results for orders placed or performed during the hospital encounter of 01/08/24  Resp panel by RT-PCR (RSV, Flu A&B, Covid) Anterior Nasal Swab   Collection Time: 01/08/24 11:17 AM   Specimen: Anterior Nasal Swab  Result Value Ref Range   SARS Coronavirus 2 by RT PCR NEGATIVE NEGATIVE   Influenza A by PCR NEGATIVE NEGATIVE   Influenza B by PCR NEGATIVE NEGATIVE   Resp Syncytial Virus by PCR NEGATIVE NEGATIVE      Assessment & Plan:   Problem List Items Addressed This Visit       Cardiovascular and Mediastinum   HTN (hypertension) - Primary     Respiratory   OSA (obstructive sleep apnea)     Follow up plan: No follow-ups on file.      "

## 2024-11-18 ENCOUNTER — Ambulatory Visit: Admitting: Nurse Practitioner

## 2024-12-12 ENCOUNTER — Telehealth: Admitting: Family

## 2024-12-12 DIAGNOSIS — N1831 Chronic kidney disease, stage 3a: Secondary | ICD-10-CM

## 2024-12-12 DIAGNOSIS — E1122 Type 2 diabetes mellitus with diabetic chronic kidney disease: Secondary | ICD-10-CM

## 2024-12-12 DIAGNOSIS — J441 Chronic obstructive pulmonary disease with (acute) exacerbation: Secondary | ICD-10-CM

## 2024-12-12 MED ORDER — GUAIFENESIN ER 600 MG PO TB12: 1200.0000 mg | ORAL_TABLET | Freq: Two times a day (BID) | ORAL | 2 refills | Status: AC

## 2024-12-12 MED ORDER — PROMETHAZINE-DM 6.25-15 MG/5ML PO SYRP: 5.0000 mL | ORAL_SOLUTION | Freq: Three times a day (TID) | ORAL | 0 refills | Status: AC | PRN

## 2024-12-12 MED ORDER — CETIRIZINE HCL 10 MG PO TABS: 10.0000 mg | ORAL_TABLET | Freq: Every day | ORAL | 1 refills | Status: AC

## 2024-12-12 MED ORDER — BENZONATATE 200 MG PO CAPS: 200.0000 mg | ORAL_CAPSULE | Freq: Two times a day (BID) | ORAL | 0 refills | Status: AC | PRN

## 2024-12-12 NOTE — Progress Notes (Signed)
 " Virtual Visit Consent   Debbie Hendricks, you are scheduled for a virtual visit with a [facility] provider today. Just as with appointments in the office, your consent must be obtained to participate. Your consent will be active for this visit and any virtual visit you may have with one of our providers in the next 365 days. If you have a MyChart account, a copy of this consent can be sent to you electronically.  As this is a virtual visit, video technology does not allow for your provider to perform a traditional examination. This may limit your provider's ability to fully assess your condition. If your provider identifies any concerns that need to be evaluated in person or the need to arrange testing (such as labs, EKG, etc.), we will make arrangements to do so. Although advances in technology are sophisticated, we cannot ensure that it will always work on either your end or our end. If the connection with a video visit is poor, the visit may have to be switched to a telephone visit. With either a video or telephone visit, we are not always able to ensure that we have a secure connection.  By engaging in this virtual visit, you consent to the provision of healthcare and authorize for your insurance to be billed (if applicable) for the services provided during this visit. Depending on your insurance coverage, you may receive a charge related to this service.  I need to obtain your verbal consent now. Are you willing to proceed with your visit today? Debbie Hendricks has provided verbal consent on 12/12/2024 for a virtual visit (video or telephone). Bari Learn, FNP  Date: 12/12/2024 10:19 AM   Virtual Visit via Video Note   I, Bari Learn, connected with  Debbie Hendricks  ([id number], 1951/04/22) on 12/12/24 at 10:15 AM EST by a video-enabled telemedicine application and verified that I am speaking with the correct person using two identifiers.  Location: Patient: Virtual Visit Location Patient:  Home Provider: Virtual Visit Location Provider: Home Office   I discussed the limitations of evaluation and management by telemedicine and the availability of in person appointments. The patient expressed understanding and agreed to proceed.    History of Present Illness: Debbie Hendricks is a 74 y.o. who identifies as a female who was assigned female at birth, and is being seen today for cough that last night. PT has COPD. She is a diabetic and her last A1C was 6.6.   HPI: Cough This is a new problem. The current episode started yesterday. The problem has been unchanged. The problem occurs every few minutes. The cough is Productive of purulent sputum. Associated symptoms include headaches, nasal congestion, postnasal drip, rhinorrhea, a sore throat (raw), shortness of breath and wheezing. Pertinent negatives include no chills, ear congestion, ear pain, fever or myalgias. She has tried rest for the symptoms. The treatment provided mild relief. Her past medical history is significant for emphysema.    Problems:  Patient Active Problem List   Diagnosis Date Noted   Chronic obstructive pulmonary disease (HCC) 11/13/2024   Pulmonary hypertension (HCC) 12/06/2023   Hypercholesterolemia 04/13/2022   Stage 3a chronic kidney disease (HCC) 04/13/2022   OSA (obstructive sleep apnea)    Other insomnia 08/03/2020   Nocturnal hypoxia 08/03/2020   Other hypersomnia 08/03/2020   GERD (gastroesophageal reflux disease) 08/01/2020   HTN (hypertension) 08/01/2020   Type 2 diabetes mellitus with stage 3a chronic kidney disease, without long-term current use of insulin (HCC)  05/04/2019   Stroke (HCC) 06/23/2018   Chronic pain syndrome 05/30/2017   Cigarette smoker 02/06/2016   Depression with anxiety 02/06/2016   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  Resting comfortably  at home.  Head is normocephalic, atraumatic.  No labored breathing.  Speech is clear and coherent with logical content.  Patient is alert and oriented at baseline.  Coarse nonproductive cough  Assessment and Plan: 1. COPD exacerbation (HCC) (Primary) - guaiFENesin  (MUCINEX ) 600 MG 12 hr tablet; Take 2 tablets (1,200 mg total) by mouth 2 (two) times daily.  Dispense: 60 tablet; Refill: 2  2. Type 2 diabetes mellitus with stage 3a chronic kidney disease, without long-term current use of insulin (HCC)  Pt states she can not tolerate steroids because they give her tremors Will hold off on antibiotics at this time since it started this AM - Take meds as prescribed - Use a cool mist humidifier  -Use saline nose sprays frequently -Force fluids -For any cough or congestion  Use plain Mucinex - regular strength or max strength is fine -For fever or aces or pains- take tylenol  or ibuprofen. -Throat lozenges if help -Follow up if symptoms worsen or do not improve   Follow Up Instructions: I discussed the assessment and treatment plan with the patient. The patient was provided an opportunity to ask questions and all were answered. The patient agreed with the plan and demonstrated an understanding of the instructions.  A copy of instructions were sent to the patient via MyChart unless otherwise noted below.     The patient was advised to call back or seek an in-person evaluation if the symptoms worsen or if the condition fails to improve as anticipated.    Bari Learn, FNP    [1]  Allergies Allergen Reactions   Other Other (See Comments)    Constipation    Sulfa  Antibiotics     RASH   Lisinopril Diarrhea    angioedema   Albuterol  Other (See Comments)    Patient uses albuterol  ...unsure why this is listed    Amoxicillin Diarrhea   Clindamycin      Thalia & C/P & angioedema   Gabapentin    Hydroxyzine      Makes body tremors worse   Morphine  And Codeine     Risperidone     Pt. Claims she had CVA  [2]  Current Outpatient Medications:    benzonatate  (TESSALON ) 200 MG capsule, Take 1 capsule (200 mg total) by mouth 2 (two) times daily as needed for cough., Disp: 20 capsule, Rfl: 0   cetirizine  (ZYRTEC  ALLERGY) 10 MG tablet, Take 1 tablet (10 mg total) by mouth daily., Disp: 90 tablet, Rfl: 1   guaiFENesin  (MUCINEX ) 600 MG 12 hr tablet, Take 2 tablets (1,200 mg total) by mouth 2 (two) times daily., Disp: 60 tablet, Rfl: 2   promethazine -dextromethorphan (PROMETHAZINE -DM) 6.25-15 MG/5ML syrup, Take 5 mLs by mouth 3 (three) times daily as needed for cough., Disp: 118 mL, Rfl: 0   albuterol  (PROAIR  HFA) 108 (90 Base) MCG/ACT inhaler, Inhale 1-2 puffs into the lungs every 6 (six) hours as needed for wheezing or shortness of breath., Disp: 1 each, Rfl: 0   amLODipine (NORVASC) 5 MG tablet, Take 5 mg by mouth daily., Disp: , Rfl:    aspirin 81 MG chewable tablet, Chew by mouth daily., Disp: , Rfl:    atorvastatin (LIPITOR) 20 MG  tablet, Take 20 mg by mouth daily., Disp: , Rfl:    clonazePAM (KLONOPIN) 1 MG tablet, Take 1 mg by mouth 3 (three) times daily as needed for anxiety., Disp: , Rfl:    cyclobenzaprine (FLEXERIL) 5 MG tablet, Take by mouth., Disp: , Rfl:    diclofenac  Sodium (VOLTAREN ) 1 % GEL, Voltaren  1 % topical gel, Disp: , Rfl:    fluticasone (FLONASE) 50 MCG/ACT nasal spray, Place into both nostrils daily., Disp: , Rfl:    ipratropium (ATROVENT ) 0.03 % nasal spray, Place 2 sprays into both nostrils 4 (four) times daily., Disp: 30 mL, Rfl: 0   levothyroxine (SYNTHROID, LEVOTHROID) 100 MCG tablet, Take 125 mcg by mouth daily before breakfast. , Disp: , Rfl:    losartan-hydrochlorothiazide (HYZAAR) 100-25 MG tablet, Take 1 tablet by mouth daily., Disp: , Rfl:    meclizine  (ANTIVERT ) 25 MG tablet, Take 1 tablet (25 mg total) by mouth 3 (three) times daily as needed for dizziness., Disp: 30 tablet, Rfl: 0   metFORMIN (GLUCOPHAGE) 500  MG tablet, Take 1 pill daily twice daily for diabetes, Disp: , Rfl:    montelukast (SINGULAIR) 10 MG tablet, Take 10 mg by mouth at bedtime., Disp: , Rfl:    nicotine polacrilex (COMMIT) 2 MG lozenge, Place inside cheek., Disp: , Rfl:    ondansetron  (ZOFRAN -ODT) 4 MG disintegrating tablet, 1-2 q 8h prn nausea, Disp: 20 tablet, Rfl: 0   pantoprazole (PROTONIX) 40 MG tablet, Take 40 mg by mouth 2 (two) times daily., Disp: , Rfl:    primidone (MYSOLINE) 50 MG tablet, Take by mouth., Disp: , Rfl:    promethazine  (PHENERGAN ) 12.5 MG tablet, Take by mouth., Disp: , Rfl:    propranolol (INDERAL) 40 MG tablet, Take 40 mg by mouth 2 (two) times daily., Disp: , Rfl:    QUEtiapine (SEROQUEL) 25 MG tablet, Take 12.5-25 mg by mouth at bedtime., Disp: , Rfl:    SPIRIVA HANDIHALER 18 MCG inhalation capsule, INHALE 1 CAPSULE VIA HANDIHALER ONCE DAILY AT THE SAME TIME EVERY DAY, Disp: , Rfl: 2   sucralfate (CARAFATE) 1 g tablet, Take 1 g by mouth 4 (four) times daily., Disp: , Rfl:    venlafaxine (EFFEXOR) 75 MG tablet, Take 75 mg by mouth 2 (two) times daily with a meal., Disp: , Rfl:   "

## 2024-12-12 NOTE — Patient Instructions (Signed)
 Chronic Obstructive Pulmonary Disease Exacerbation  Chronic obstructive pulmonary disease (COPD) is a long-term (chronic) lung problem. When you have COPD, it can feel harder to breathe in or out. COPD exacerbation is a flare-up of symptoms when breathing gets worse and more treatment may be needed. Without treatment, flare-ups can be life-threatening. If they happen often, your lungs can become more damaged. What are the causes? Not taking your usual COPD medicines as told by your health care provider. A cold or the flu, which can cause infection in your lungs. Being exposed to things that make your breathing worse, such as: Smoke. Air pollution. Fumes. Dust. Allergies. Weather changes. What are the signs or symptoms? Symptoms do not get better or get worse even if you take your medicines as told by your provider. Symptoms may include: More shortness of breath. You may only be able to speak one or two words at a time. More coughing or mucus from your lungs. More wheezing or chest tightness. Being more tired and having less energy. Confusion. How is this diagnosed? This condition is diagnosed based on: Symptoms that get worse. Your medical history. A physical exam. You may also have tests, including: A chest X-ray. Blood or mucus tests. How is this treated? You may be able to stay home or you may need to go to the hospital. Treatment may include: Taking medicines. These may include: Inhalers. These have medicines in them that you breathe in. These may be more of what you already take or they may be new. Steroids. These reduce inflammation in the airways. These may be inhaled, taken by mouth, or given in an IV. Antibiotics. These treat infection. Using oxygen. Using a device to help you clear mucus. Follow these instructions at home: Medicines Take your medicines only as told by your provider. If you were given antibiotics or steroids, take them as told by your provider. Do  not stop taking them even if you start to feel better. Lifestyle Several times a day, wash your hands with soap and water for at least 20 seconds. If you cannot use soap and water, use hand sanitizer. This may help keep you from getting an infection. Avoid being around crowds or people who are sick. Do not smoke or use any products that contain nicotine or tobacco. If you need help quitting, ask your provider. Return to your normal activities when your provider says that it's safe. Use breathing methods to control your stress and catch your breath. How is this prevented? Follow your COPD action plan. The action plan tells you what to do if you're feeling good and what to do when you start feeling worse. Discuss the plan often with your provider. Make sure you get all the shots, also called vaccines, that your provider recommends. Ask your provider about a flu shot and a pneumonia shot. Use oxygen therapy if told by your provider. If you need home oxygen therapy, ask your provider how often to check your oxygen level with a device called an oximeter. Keep all follow-up visits to review your COPD action plan. Your provider will want to check on your condition often to keep you healthy and out of the hospital. Contact a health care provider if: Your COPD symptoms get worse. You have a fever or chills. You have trouble doing daily activities. You have trouble breathing even when you are resting. Get help right away if: You are short of breath and cannot: Talk in full sentences. Do normal activities. You have chest  pain. You feel confused. These symptoms may be an emergency. Call 911 right away. Do not wait to see if the symptoms will go away. Do not drive yourself to the hospital. This information is not intended to replace advice given to you by your health care provider. Make sure you discuss any questions you have with your health care provider. Document Revised: 08/08/2023 Document  Reviewed: 01/21/2023 Elsevier Patient Education  2024 [employer].

## 2024-12-17 ENCOUNTER — Emergency Department

## 2024-12-17 ENCOUNTER — Other Ambulatory Visit: Payer: Self-pay

## 2024-12-17 ENCOUNTER — Emergency Department
Admission: EM | Admit: 2024-12-17 | Discharge: 2024-12-18 | Disposition: A | Discharge status: Home / Self Care | Attending: Emergency Medicine | Admitting: Emergency Medicine

## 2024-12-17 DIAGNOSIS — Z79899 Other long term (current) drug therapy: Secondary | ICD-10-CM | POA: Insufficient documentation

## 2024-12-17 DIAGNOSIS — R29810 Facial weakness: Secondary | ICD-10-CM | POA: Insufficient documentation

## 2024-12-17 DIAGNOSIS — F419 Anxiety disorder, unspecified: Secondary | ICD-10-CM | POA: Insufficient documentation

## 2024-12-17 DIAGNOSIS — R251 Tremor, unspecified: Secondary | ICD-10-CM | POA: Insufficient documentation

## 2024-12-17 DIAGNOSIS — E785 Hyperlipidemia, unspecified: Secondary | ICD-10-CM | POA: Insufficient documentation

## 2024-12-17 DIAGNOSIS — E119 Type 2 diabetes mellitus without complications: Secondary | ICD-10-CM | POA: Insufficient documentation

## 2024-12-17 DIAGNOSIS — R519 Headache, unspecified: Secondary | ICD-10-CM | POA: Diagnosis present

## 2024-12-17 DIAGNOSIS — Z9889 Other specified postprocedural states: Secondary | ICD-10-CM | POA: Diagnosis not present

## 2024-12-17 DIAGNOSIS — R4701 Aphasia: Secondary | ICD-10-CM | POA: Diagnosis present

## 2024-12-17 DIAGNOSIS — I10 Essential (primary) hypertension: Secondary | ICD-10-CM | POA: Insufficient documentation

## 2024-12-17 DIAGNOSIS — R079 Chest pain, unspecified: Secondary | ICD-10-CM | POA: Diagnosis present

## 2024-12-17 LAB — CBC
HCT: 36.6 % (ref 36.0–46.0)
Hemoglobin: 12.4 g/dL (ref 12.0–15.0)
MCH: 32.3 pg (ref 26.0–34.0)
MCHC: 33.9 g/dL (ref 30.0–36.0)
MCV: 95.3 fL (ref 80.0–100.0)
Platelets: 329 10*3/uL (ref 150–400)
RBC: 3.84 MIL/uL — ABNORMAL LOW (ref 3.87–5.11)
RDW: 14.1 % (ref 11.5–15.5)
WBC: 12.8 10*3/uL — ABNORMAL HIGH (ref 4.0–10.5)
nRBC: 0 % (ref 0.0–0.2)

## 2024-12-17 LAB — PROTIME-INR
INR: 1 (ref 0.8–1.2)
Prothrombin Time: 13.2 s (ref 11.4–15.2)

## 2024-12-17 LAB — DIFFERENTIAL
Abs Immature Granulocytes: 0.06 10*3/uL (ref 0.00–0.07)
Basophils Absolute: 0.1 10*3/uL (ref 0.0–0.1)
Basophils Relative: 1 %
Eosinophils Absolute: 0.2 10*3/uL (ref 0.0–0.5)
Eosinophils Relative: 2 %
Immature Granulocytes: 1 %
Lymphocytes Relative: 32 %
Lymphs Abs: 4.1 10*3/uL — ABNORMAL HIGH (ref 0.7–4.0)
Monocytes Absolute: 1.1 10*3/uL — ABNORMAL HIGH (ref 0.1–1.0)
Monocytes Relative: 8 %
Neutro Abs: 7.3 10*3/uL (ref 1.7–7.7)
Neutrophils Relative %: 56 %

## 2024-12-17 LAB — COMPREHENSIVE METABOLIC PANEL WITH GFR
ALT: 10 U/L (ref 0–44)
AST: 16 U/L (ref 15–41)
Albumin: 4.6 g/dL (ref 3.5–5.0)
Alkaline Phosphatase: 72 U/L (ref 38–126)
Anion gap: 17 — ABNORMAL HIGH (ref 5–15)
BUN: 28 mg/dL — ABNORMAL HIGH (ref 8–23)
CO2: 25 mmol/L (ref 22–32)
Calcium: 9.5 mg/dL (ref 8.9–10.3)
Chloride: 99 mmol/L (ref 98–111)
Creatinine, Ser: 1.15 mg/dL — ABNORMAL HIGH (ref 0.44–1.00)
GFR, Estimated: 51 mL/min — ABNORMAL LOW
Glucose, Bld: 99 mg/dL (ref 70–99)
Potassium: 3.3 mmol/L — ABNORMAL LOW (ref 3.5–5.1)
Sodium: 141 mmol/L (ref 135–145)
Total Bilirubin: <0.2 mg/dL (ref 0.0–1.2)
Total Protein: 7.7 g/dL (ref 6.5–8.1)

## 2024-12-17 LAB — APTT: aPTT: 29 s (ref 24–36)

## 2024-12-17 LAB — ETHANOL: Alcohol, Ethyl (B): <15 mg/dL

## 2024-12-17 LAB — CBG MONITORING, ED: Glucose-Capillary: 104 mg/dL — ABNORMAL HIGH (ref 70–99)

## 2024-12-17 MED ORDER — SODIUM CHLORIDE 0.9 % IV BOLUS
1000.0000 mL | Freq: Once | INTRAVENOUS | Status: AC
  Administered 2024-12-17: 1000 mL via INTRAVENOUS

## 2024-12-17 NOTE — ED Triage Notes (Signed)
 Pt arrives via EMS from home; since yesterday having diff speaking, slurred speech, HA; hx migraine

## 2024-12-17 NOTE — ED Triage Notes (Signed)
 Pt reports difficulty keeping secretions in mouth on L side and feels that a droop is present. No facial droop noted in triage. Pt again maintaining all secretions. Pt is good historian providing very detailed timeline and report of symptom onset and progression.

## 2024-12-17 NOTE — ED Triage Notes (Signed)
 Pt reports slurred speech and difficulty finding words since yesterday at 1930. Denies hitting head or LOC. Pt alert and oriented on arrival, following all commands and speaking in full complete sentences. No slurred speech or expressive aphasia noted in triage. Pt endorses chronic hx of migraines. Pt pupils equal and reactive. Denies vision change or parasthesia. NIH 0.

## 2024-12-17 NOTE — ED Provider Notes (Signed)
 "  [facility] Provider Note    Event Date/Time   First MD Initiated Contact with Patient 12/17/24 2229     (approximate)  History   Chief Complaint: Aphasia  HPI  Debbie Hendricks is a 74 y.o. female with a past medical history of anxiety, diabetes, hypertension, hyperlipidemia, presents to the emergency department for medical evaluation.  According to the patient yesterday she had significant shaking states she has a history of shaking/tremor at baseline but she thought it was more pronounced on her right side.  Patient also states yesterday she was having some trouble speaking described more as a stutter to her speech.  Patient does admit she has a history of anxiety but states she has never had such significant symptoms with her anxiety before.  Today the son thought he noted a slight facial droop on the left side after talking to her she told him about stopping last night he was concerned so he brought her to the emergency department for evaluation.  No facial droop seen on my evaluation currently.  Physical Exam   Triage Vital Signs: ED Triage Vitals  Encounter Vitals Group     BP 12/17/24 2127 (!) 128/110     Girls Systolic BP Percentile --      Girls Diastolic BP Percentile --      Boys Systolic BP Percentile --      Boys Diastolic BP Percentile --      Pulse Rate 12/17/24 2127 85     Resp 12/17/24 2127 18     Temp 12/17/24 2127 98.1 F (36.7 C)     Temp Source 12/17/24 2127 Oral     SpO2 12/17/24 2120 96 %     Weight --      Height --      Head Circumference --      Peak Flow --      Pain Score 12/17/24 2125 0     Pain Loc --      Pain Education --      Exclude from Growth Chart --     Most recent vital signs: Vitals:   12/17/24 2120 12/17/24 2127  BP:  (!) 128/110  Pulse:  85  Resp:  18  Temp:  98.1 F (36.7 C)  SpO2: 96% 94%    General: Awake, no distress.  CV:  Good peripheral perfusion.  Regular rate and rhythm  Resp:  Normal  effort.  Equal breath sounds bilaterally.  Abd:  No distention.  Soft, nontender.  No rebound or guarding. Other:  Equal grip strength bilaterally 5/5 strength in all extremities.  No cranial nerve deficits on my examination.  No facial droop.   ED Results / Procedures / Treatments   EKG  EKG viewed and interpreted by myself shows normal sinus rhythm 87 bpm with a narrow QRS, normal axis, normal intervals, no concerning ST changes.  RADIOLOGY  I have reviewed and interpreted the CT head images.  No large bleed seen on my evaluation. Radiology has read the CT scan as negative for acute abnormality.  MEDICATIONS ORDERED IN ED: Medications - No data to display   IMPRESSION / MDM / ASSESSMENT AND PLAN / ED COURSE  I reviewed the triage vital signs and the nursing notes.  Patient's presentation is most consistent with acute presentation with potential threat to life or bodily function.  Patient presents emergency department for stutter to her speech last night possible facial droop today.  No symptoms currently.  Patient states she has a history of anxiety does take Klonopin.  She is not sure if her symptoms last night were due to anxiety.  Here the patient has a reassuring neurological exam.  Patient states she has not noticed any symptoms today whatsoever however the son states at 1 point earlier today he thought she might of had a slight facial droop but states she did just wake up when he noticed it.  Overall the patient appears well, reassuring physical exam reassuring neurological exam.  Patient's lab work today shows a reassuring CBC reassuring chemistry besides slight anion gap elevation of 17 we will IV hydrate.  CT scan of the head is negative.  However given the patient's symptoms we will proceed with an MRI of the brain.  If the MRI is negative I believe the patient could be discharged home with outpatient follow-up.  If the MRI is positive patient will likely require admission for  further workup.  Patient care signed out to oncoming provider MRI pending.  FINAL CLINICAL IMPRESSION(S) / ED DIAGNOSES   Tremor    Note:  This document was prepared using Dragon voice recognition software and may include unintentional dictation errors.   Dorothyann Drivers, MD 12/17/24 2249  "

## 2024-12-18 NOTE — Discharge Instructions (Addendum)
 Your MRI of the brain and other tests tonight are all okay.  There is no sign of a stroke or other acute issue.  Please continue taking your medications and follow-up with your doctor.

## 2024-12-21 ENCOUNTER — Other Ambulatory Visit: Payer: Self-pay | Admitting: Family

## 2025-03-10 ENCOUNTER — Ambulatory Visit: Admitting: Nurse Practitioner

## 2025-09-08 ENCOUNTER — Ambulatory Visit: Admitting: Family Medicine
